# Patient Record
Sex: Female | Born: 1990 | Race: Black or African American | Hispanic: No | Marital: Married | Smoking: Never smoker
Health system: Southern US, Community
[De-identification: ages and names within clinical notes are randomized; demographics above are authoritative.]

## PROBLEM LIST (undated history)

## (undated) ENCOUNTER — Inpatient Hospital Stay (HOSPITAL_COMMUNITY): Payer: Self-pay

## (undated) DIAGNOSIS — Z8759 Personal history of other complications of pregnancy, childbirth and the puerperium: Secondary | ICD-10-CM

## (undated) DIAGNOSIS — R030 Elevated blood-pressure reading, without diagnosis of hypertension: Secondary | ICD-10-CM

## (undated) HISTORY — DX: Elevated blood-pressure reading, without diagnosis of hypertension: R03.0

---

## 2011-10-12 ENCOUNTER — Encounter: Payer: Self-pay | Admitting: *Deleted

## 2011-10-12 ENCOUNTER — Emergency Department (INDEPENDENT_AMBULATORY_CARE_PROVIDER_SITE_OTHER)
Admission: EM | Admit: 2011-10-12 | Discharge: 2011-10-12 | Disposition: A | Discharge status: Home / Self Care | Payer: 59 | Source: Home / Self Care | Attending: Family Medicine | Admitting: Family Medicine

## 2011-10-12 DIAGNOSIS — B9689 Other specified bacterial agents as the cause of diseases classified elsewhere: Secondary | ICD-10-CM

## 2011-10-12 DIAGNOSIS — J029 Acute pharyngitis, unspecified: Secondary | ICD-10-CM

## 2011-10-12 MED ORDER — HYDROCODONE-ACETAMINOPHEN 5-500 MG PO TABS: 1.0000 | ORAL_TABLET | Freq: Four times a day (QID) | ORAL | Status: AC | PRN

## 2011-10-12 MED ORDER — AMOXICILLIN 500 MG PO CAPS: ORAL_CAPSULE | ORAL | Status: DC

## 2011-10-12 MED ORDER — IBUPROFEN 600 MG PO TABS: 600.0000 mg | ORAL_TABLET | Freq: Three times a day (TID) | ORAL | Status: AC | PRN

## 2011-10-12 NOTE — ED Notes (Signed)
Pt  Reports  Symptoms  Of  sorethroat  Body  Aches  As  Well as    Swollen tencer  Neck  Glands  Which  She reports  Began  yest    She  Is  Sitting  Upright on the  Exam table  Speaking in  Complete  sentances  And  aapears  In no  aciute  Distress

## 2011-10-15 NOTE — ED Provider Notes (Signed)
History     CSN: [id number]  Arrival date & time 10/12/11  1646   First MD Initiated Contact with Patient 10/12/11 1704      Chief Complaint  Patient presents with  . Sore Throat    (Consider location/radiation/quality/duration/timing/severity/associated sxs/prior treatment) HPI Comments: 20 y/o female here with sore throat, body aches and headache for 2 days. Subjective fever. Mild discomfort with swallowing but no dysphagia. Drinking fluids and eating solids well. No abdominal pain, denies cough and rhinorrhea.   History reviewed. No pertinent past medical history.  History reviewed. No pertinent past surgical history.  History reviewed. No pertinent family history.  History  Substance Use Topics  . Smoking status: Not on file  . Smokeless tobacco: Not on file  . Alcohol Use: Not on file    OB History    Grav Para Term Preterm Abortions TAB SAB Ect Mult Living                  Review of Systems  Constitutional: Positive for fever and chills. Negative for appetite change.  HENT: Positive for sore throat. Negative for ear pain, congestion, rhinorrhea, trouble swallowing, neck pain, voice change and sinus pressure.   Eyes: Negative for discharge.  Respiratory: Negative for cough, chest tightness, shortness of breath and wheezing.     Allergies  Review of patient's allergies indicates no known allergies.  Home Medications   Current Outpatient Rx  Name Route Sig Dispense Refill  . LEVONORGESTREL-ETHINYL ESTRAD 0.1-20 MG-MCG PO TABS Oral Take 1 tablet by mouth daily.      . AMOXICILLIN 500 MG PO CAPS  1 tab po tid for 10 days 30 capsule 0  . HYDROCODONE-ACETAMINOPHEN 5-500 MG PO TABS Oral Take 1-2 tablets by mouth every 6 (six) hours as needed for pain. 10 tablet 0  . IBUPROFEN 600 MG PO TABS Oral Take 1 tablet (600 mg total) by mouth every 8 (eight) hours as needed for pain or fever. 20 tablet 0    BP 141/95  Pulse 95  Temp(Src) 99 F (37.2 C) (Oral)  Resp  20  SpO2 100%  LMP 10/12/2011  Physical Exam  Nursing note and vitals reviewed. Constitutional: She is oriented to person, place, and time. She appears well-developed and well-nourished. No distress.  HENT:  Head: Normocephalic and atraumatic.  Right Ear: External ear normal.  Left Ear: External ear normal.  Nose: Nose normal.        Significant pharyngeal erythema with bilateral exudates. No uvula deviation. No trismus. TM's normal.   Eyes: Conjunctivae and EOM are normal. Pupils are equal, round, and reactive to light. Right eye exhibits no discharge. Left eye exhibits no discharge.  Neck: Normal range of motion. Neck supple.  Cardiovascular: Normal rate, regular rhythm and normal heart sounds.  Exam reveals no gallop and no friction rub.   No murmur heard. Pulmonary/Chest: Effort normal and breath sounds normal. No respiratory distress. She has no wheezes. She has no rales. She exhibits no tenderness.  Abdominal: Soft. She exhibits no distension and no mass. There is no tenderness.  Lymphadenopathy:    She has cervical adenopathy.  Neurological: She is alert and oriented to person, place, and time.  Skin: Skin is warm. No rash noted.    ED Course  Procedures (including critical care time)  Labs Reviewed - No data to display No results found.   1. Bacterial pharyngitis       MDM  Centor criteria for bacteria for bacterial pharyngitis .  Treated with amoxicillin.        Sharin Grave, MD 10/15/11 1034

## 2012-05-12 ENCOUNTER — Ambulatory Visit (INDEPENDENT_AMBULATORY_CARE_PROVIDER_SITE_OTHER): Payer: 59 | Admitting: Family Medicine

## 2012-05-12 VITALS — BP 122/76 | HR 103 | Temp 98.7°F | Resp 16 | Ht 65.5 in | Wt 241.0 lb

## 2012-05-12 DIAGNOSIS — IMO0002 Reserved for concepts with insufficient information to code with codable children: Secondary | ICD-10-CM

## 2012-05-12 DIAGNOSIS — R51 Headache: Secondary | ICD-10-CM

## 2012-05-12 DIAGNOSIS — E669 Obesity, unspecified: Secondary | ICD-10-CM

## 2012-05-12 DIAGNOSIS — M25559 Pain in unspecified hip: Secondary | ICD-10-CM

## 2012-05-12 MED ORDER — BUTALBITAL-APAP-CAFFEINE 50-325-40 MG PO TABS: 2.0000 | ORAL_TABLET | Freq: Four times a day (QID) | ORAL | Status: DC | PRN

## 2012-05-12 MED ORDER — CYCLOBENZAPRINE HCL 5 MG PO TABS: 5.0000 mg | ORAL_TABLET | Freq: Three times a day (TID) | ORAL | Status: DC

## 2012-05-12 MED ORDER — TRAMADOL HCL 50 MG PO TABS: 50.0000 mg | ORAL_TABLET | Freq: Three times a day (TID) | ORAL | Status: DC | PRN

## 2012-05-12 MED ORDER — CYCLOBENZAPRINE HCL 5 MG PO TABS: 5.0000 mg | ORAL_TABLET | Freq: Three times a day (TID) | ORAL | Status: AC

## 2012-05-12 MED ORDER — TRAMADOL HCL 50 MG PO TABS: 50.0000 mg | ORAL_TABLET | Freq: Three times a day (TID) | ORAL | Status: AC | PRN

## 2012-05-12 MED ORDER — BUTALBITAL-APAP-CAFFEINE 50-325-40 MG PO TABS: 1.0000 | ORAL_TABLET | Freq: Four times a day (QID) | ORAL | Status: AC | PRN

## 2012-05-12 NOTE — Progress Notes (Signed)
  [facility]:  Office Visit  Chief Complaint:  Chief Complaint  Patient presents with  . Sciatica    Right side x 1 yr off and on but worse last day and a half  . Headache    x 5 years    HPI: Donna Mccullough is a 21 y.o. female who complains of  Right hip pain x 1 year intermittent. 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 Walking would cause her to get a sharp pain 6/10 where she would  jump. Today constant. NKI, no prior trauma.  Deneis catching, clicking or popping. Has not tried anything for it.   Past Medical History  Diagnosis Date  . Hypertension    No past surgical history on file. History   Social History  . Marital Status: Single    Spouse Name: N/A    Number of Children: N/A  . Years of Education: N/A   Social History Main Topics  . Smoking status: Never Smoker   . Smokeless tobacco: None  . Alcohol Use: None  . Drug Use: None  . Sexually Active: None   Other Topics Concern  . None   Social History Narrative  . None   No family history on file. No Known Allergies Prior to Admission medications   Medication Sig Start Date End Date Taking? Authorizing Provider  levonorgestrel-ethinyl estradiol (AVIANE,ASSE,SSINA) 0.1-20 MG-MCG tablet Take 1 tablet by mouth daily.     Yes Historical Provider, MD     ROS: The patient denies fevers, chills, night sweats, unintentional weight loss, chest pain, palpitations, wheezing, dyspnea on exertion, nausea, vomiting, abdominal pain, dysuria, hematuria, melena, numbness, weakness, or tingling.   All other systems have been reviewed and were otherwise negative with the exception of those mentioned in the HPI and as above.    PHYSICAL EXAM: Filed Vitals:   05/12/12 1938  BP: 122/76  Pulse: 103  Temp: 98.7 F (37.1 C)  Resp: 16   Filed Vitals:   05/12/12 1938  Height: 5' 5.5" (1.664 m)  Weight: 241 lb (109.317 kg)  Repeat pulse 90  Body mass index is 39.49 kg/(m^2).  General: Alert, no acute distress.  Morbidly Obese HEENT:  Normocephalic, atraumatic, oropharynx patent. EOMI, PERRLA, fundoscopic exam nl Cardiovascular:  Regular rate and rhythm, no rubs murmurs or gallops.  No Carotid bruits, radial pulse intact. No pedal edema.  Respiratory: Clear to auscultation bilaterally.  No wheezes, rales, or rhonchi.  No cyanosis, no use of accessory musculature GI: No organomegaly, abdomen is soft and non-tender, positive bowel sounds.  No masses. Skin: No rashes. Neurologic: Facial musculature symmetric. Psychiatric: Patient is appropriate throughout our interaction. Lymphatic: No cervical lymphadenopathy Musculoskeletal: Gait intact. L-spine-nl Hips-nl sensation, ROM, 5/5 strength bilaterally, 2/2 knee and ankle DTR, + leg raise right hip    LABS: No results found for this or any previous visit.   EKG/XRAY:   Primary read interpreted by Dr. Conley Rolls at [facility].   ASSESSMENT/PLAN: Encounter Diagnoses  Name Primary?  . Headache Yes  . Hip pain   . Sprain and strain of hip and thigh    Patient is morbidly obese. 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   1. Flexeril, Tramadol, Naproxen  2. Fioricet  F/u for xray if no improvement   ,  PHUONG, DO 05/15/2012 9:03 AM

## 2012-10-15 HISTORY — PX: BREAST REDUCTION SURGERY: SHX8

## 2013-02-19 ENCOUNTER — Telehealth: Payer: Self-pay | Admitting: *Deleted

## 2013-02-19 NOTE — Telephone Encounter (Signed)
Spoke to patient regarding referral from Zoe Lan NP-C requesting an appointment.  Patient declines to schedule at this time.  States she will need to check her schedule and will call us when she is ready to schedule.

## 2013-09-25 ENCOUNTER — Other Ambulatory Visit (HOSPITAL_COMMUNITY)
Admission: RE | Admit: 2013-09-25 | Discharge: 2013-09-25 | Disposition: A | Discharge status: Home / Self Care | Payer: 59 | Source: Ambulatory Visit | Attending: Emergency Medicine | Admitting: Emergency Medicine

## 2013-09-25 ENCOUNTER — Emergency Department (INDEPENDENT_AMBULATORY_CARE_PROVIDER_SITE_OTHER)
Admission: EM | Admit: 2013-09-25 | Discharge: 2013-09-25 | Disposition: A | Discharge status: Home / Self Care | Payer: 59 | Source: Home / Self Care | Attending: Emergency Medicine | Admitting: Emergency Medicine

## 2013-09-25 ENCOUNTER — Encounter (HOSPITAL_COMMUNITY): Payer: Self-pay | Admitting: Emergency Medicine

## 2013-09-25 DIAGNOSIS — N760 Acute vaginitis: Secondary | ICD-10-CM | POA: Insufficient documentation

## 2013-09-25 DIAGNOSIS — K529 Noninfective gastroenteritis and colitis, unspecified: Secondary | ICD-10-CM

## 2013-09-25 DIAGNOSIS — K5289 Other specified noninfective gastroenteritis and colitis: Secondary | ICD-10-CM

## 2013-09-25 DIAGNOSIS — Z113 Encounter for screening for infections with a predominantly sexual mode of transmission: Secondary | ICD-10-CM | POA: Insufficient documentation

## 2013-09-25 DIAGNOSIS — N730 Acute parametritis and pelvic cellulitis: Secondary | ICD-10-CM

## 2013-09-25 LAB — CBC WITH DIFFERENTIAL/PLATELET
Basophils Absolute: 0 10*3/uL (ref 0.0–0.1)
Eosinophils Relative: 1 % (ref 0–5)
HCT: 35.2 % — ABNORMAL LOW (ref 36.0–46.0)
Lymphocytes Relative: 24 % (ref 12–46)
MCV: 80.7 fL (ref 78.0–100.0)
Monocytes Absolute: 0.5 10*3/uL (ref 0.1–1.0)
RDW: 13.8 % (ref 11.5–15.5)
WBC: 4.5 10*3/uL (ref 4.0–10.5)

## 2013-09-25 LAB — POCT URINALYSIS DIP (DEVICE)
Ketones, ur: NEGATIVE mg/dL
Protein, ur: NEGATIVE mg/dL
Specific Gravity, Urine: >=1.03 (ref 1.005–1.030)
pH: 7 (ref 5.0–8.0)

## 2013-09-25 LAB — POCT I-STAT, CHEM 8
BUN: 8 mg/dL (ref 6–23)
HCT: 40 % (ref 36.0–46.0)
Hemoglobin: 13.6 g/dL (ref 12.0–15.0)
Sodium: 140 mEq/L (ref 135–145)
TCO2: 24 mmol/L (ref 0–100)

## 2013-09-25 LAB — POCT PREGNANCY, URINE: Preg Test, Ur: NEGATIVE

## 2013-09-25 MED ORDER — CEFTRIAXONE SODIUM 250 MG IJ SOLR
INTRAMUSCULAR | Status: AC
  Filled 2013-09-25: qty 250

## 2013-09-25 MED ORDER — IBUPROFEN 800 MG PO TABS
800.0000 mg | ORAL_TABLET | Freq: Once | ORAL | Status: AC
Start: 2013-09-25 — End: 2013-09-25
  Administered 2013-09-25: 800 mg via ORAL

## 2013-09-25 MED ORDER — AZITHROMYCIN 250 MG PO TABS
1000.0000 mg | ORAL_TABLET | Freq: Once | ORAL | Status: AC
  Administered 2013-09-25: 1000 mg via ORAL

## 2013-09-25 MED ORDER — CIPROFLOXACIN HCL 500 MG PO TABS: 500.0000 mg | ORAL_TABLET | Freq: Two times a day (BID) | ORAL | Status: DC

## 2013-09-25 MED ORDER — IBUPROFEN 800 MG PO TABS
ORAL_TABLET | ORAL | Status: AC
  Filled 2013-09-25: qty 1

## 2013-09-25 MED ORDER — ONDANSETRON 8 MG PO TBDP: 8.0000 mg | ORAL_TABLET | Freq: Three times a day (TID) | ORAL | Status: DC | PRN

## 2013-09-25 MED ORDER — METRONIDAZOLE 500 MG PO TABS: 500.0000 mg | ORAL_TABLET | Freq: Three times a day (TID) | ORAL | Status: DC

## 2013-09-25 MED ORDER — CEFTRIAXONE SODIUM 250 MG IJ SOLR
250.0000 mg | Freq: Once | INTRAMUSCULAR | Status: AC
  Administered 2013-09-25: 250 mg via INTRAMUSCULAR

## 2013-09-25 MED ORDER — TRAMADOL HCL 50 MG PO TABS: 100.0000 mg | ORAL_TABLET | Freq: Three times a day (TID) | ORAL | Status: DC | PRN

## 2013-09-25 MED ORDER — AZITHROMYCIN 250 MG PO TABS
ORAL_TABLET | ORAL | Status: AC
  Filled 2013-09-25: qty 4

## 2013-09-25 NOTE — ED Notes (Signed)
Patient unable to give stool specimen.  Patient given instruction and supplies to bring stool specimen in this weekend.  Dr Lorenz Coaster notified

## 2013-09-25 NOTE — ED Provider Notes (Signed)
Chief Complaint:   Chief Complaint  Patient presents with  . Abdominal Cramping    History of Present Illness:   Donna Mccullough is a 22 year old female who has had symptoms going on for the past week of diarrhea, headaches, sweats, pruritus, cramping, vaginal pain, nausea, vomiting, and fever. The patient states her symptoms began a week ago with loose stools. She would have about 3 stools a day and they would usually occur after a meal. There was occasionally some blood in the toilet tissue. This was not a great deal of blood, just a few small spots. The patient cannot recall having eaten anything suspicious or been exposed to anyone with similar symptoms. No recent foreign travel or animal exposure. She then developed migraine type headaches which occur almost every day. She has a history of migraine headaches in the past, but states these went away for a while after she had a breast reduction. She denies any visual or neurological symptoms. They've been accompanied by photophobia and phonophobia. She had couple episodes of cold sweats. Several days ago her face felt itchy but she did not have any rash. She's had menstrual-like cramps in her lower abdomen with some spotting bleeding. Last menstrual period was November 26. She is sexually active and takes tri-Sprintec for birth control. She's had daily vaginal pain. She felt nauseated and vomited for his one day, a couple days ago, but now this is stopped. Today she has a fever 101.8.  Review of Systems:  Other than noted above, the patient denies any of the following symptoms: Systemic:  No fevers, chills, sweats, weight loss or gain, fatigue, or tiredness. ENT:  No nasal congestion, rhinorrhea, or sore throat. Lungs:  No cough, wheezing, or shortness of breath. Cardiac:  No chest pain, syncope, or presyncope. GI:  No abdominal pain, nausea, vomiting, anorexia, diarrhea, constipation, blood in stool or vomitus. GU:  No dysuria, frequency, or  urgency.  PMFSH:  Past medical history, family history, social history, meds, and allergies were reviewed.  She has a history of fatty liver.  Physical Exam:   Vital signs:  BP 133/81  Pulse 120  Temp(Src) 101.8 F (38.8 C) (Oral)  Resp 18  SpO2 100%  LMP 09/09/2013 Filed Vitals:   09/25/13 1659 Baseline  09/25/13 1747 Supine  09/25/13 1748 Sitting  09/25/13 1752 Standing   BP: 136/88 140/85 132/78 133/81  Pulse: 109 110 107 120  Temp: 101.8 F (38.8 C)     TempSrc: Oral     Resp: 18     SpO2: 100%      General:  Alert and oriented.  In no distress.  Skin warm and dry.  Good skin turgor, brisk capillary refill. ENT:  No scleral icterus, moist mucous membranes, no oral lesions, pharynx clear. Lungs:  Breath sounds clear and equal bilaterally.  No wheezes, rales, or rhonchi. Heart:  Rhythm regular, without extrasystoles.  No gallops or murmers. Abdomen:  Soft, flat, nondistended. No organomegaly or mass. Bowel sounds are normally active. No tenderness, guarding, or rebound. Pelvic exam: Normal external genitalia. Vaginal and cervical mucosa were normal. There was no discharge. She has moderate pain on cervical motion. Uterus is normal in size and shape and moderately tender. She has moderate bilateral adnexal tenderness but no mass. DNA probes for gonorrhea, Chlamydia, Trichomonas, and Gardnerella were obtained. Skin: Clear, warm, and dry.  Good turgor.  Brisk capillary refill.  Labs:   Results for orders placed during the hospital encounter of 09/25/13  CBC  WITH DIFFERENTIAL      Result Value Range   WBC 4.5  4.0 - 10.5 K/uL   RBC 4.36  3.87 - 5.11 MIL/uL   Hemoglobin 11.8 (*) 12.0 - 15.0 g/dL   HCT 47.8 (*) 29.5 - 62.1 %   MCV 80.7  78.0 - 100.0 fL   MCH 27.1  26.0 - 34.0 pg   MCHC 33.5  30.0 - 36.0 g/dL   RDW 30.8  65.7 - 84.6 %   Platelets 285  150 - 400 K/uL   Neutrophils Relative % 65  43 - 77 %   Neutro Abs 2.9  1.7 - 7.7 K/uL   Lymphocytes Relative 24  12 - 46 %    Lymphs Abs 1.1  0.7 - 4.0 K/uL   Monocytes Relative 10  3 - 12 %   Monocytes Absolute 0.5  0.1 - 1.0 K/uL   Eosinophils Relative 1  0 - 5 %   Eosinophils Absolute 0.0  0.0 - 0.7 K/uL   Basophils Relative 0  0 - 1 %   Basophils Absolute 0.0  0.0 - 0.1 K/uL  POCT URINALYSIS DIP (DEVICE)      Result Value Range   Glucose, UA NEGATIVE  NEGATIVE mg/dL   Bilirubin Urine NEGATIVE  NEGATIVE   Ketones, ur NEGATIVE  NEGATIVE mg/dL   Specific Gravity, Urine >=1.030  1.005 - 1.030   Hgb urine dipstick SMALL (*) NEGATIVE   pH 7.0  5.0 - 8.0   Protein, ur NEGATIVE  NEGATIVE mg/dL   Urobilinogen, UA 0.2  0.0 - 1.0 mg/dL   Nitrite NEGATIVE  NEGATIVE   Leukocytes, UA NEGATIVE  NEGATIVE  POCT PREGNANCY, URINE      Result Value Range   Preg Test, Ur NEGATIVE  NEGATIVE  POCT I-STAT, CHEM 8      Result Value Range   Sodium 140  135 - 145 mEq/L   Potassium 3.6  3.5 - 5.1 mEq/L   Chloride 103  96 - 112 mEq/L   BUN 8  6 - 23 mg/dL   Creatinine, Ser 9.62  0.50 - 1.10 mg/dL   Glucose, Bld 89  70 - 99 mg/dL   Calcium, Ion 9.52  8.41 - 1.23 mmol/L   TCO2 24  0 - 100 mmol/L   Hemoglobin 13.6  12.0 - 15.0 g/dL   HCT 32.4  40.1 - 02.7 %     Course in Urgent Care Center:   She was given Motrin 800 mg for pain. For her PID she was given Rocephin 250 mg IM and azithromycin 1000 mg by mouth.  Assessment:  The primary encounter diagnosis was Gastroenteritis. A diagnosis of PID (acute pelvic inflammatory disease) was also pertinent to this visit.  Since the gastroenteritis has been going on for now 7 days, it's more likely be a bacterial gastroenteritis. I ordered a stool culture, but she was not able to provide a specimen tonight. She'll try to get one tomorrow morning and bring up by the lab here. Will start presumptively on Cipro and metronidazole. This also should cover for PID as well. She was given Zofran for the nausea and tramadol for the pain. She has an appointment to see her primary care nurse  practitioner on Monday.  Plan:   1.  Meds:  The following meds were prescribed:   Discharge Medication List as of 09/25/2013  7:10 PM    START taking these medications   Details  ciprofloxacin (CIPRO) 500 MG tablet Take  1 tablet (500 mg total) by mouth every 12 (twelve) hours., Starting 09/25/2013, Until Discontinued, Normal    metroNIDAZOLE (FLAGYL) 500 MG tablet Take 1 tablet (500 mg total) by mouth 3 (three) times daily., Starting 09/25/2013, Until Discontinued, Normal    ondansetron (ZOFRAN ODT) 8 MG disintegrating tablet Take 1 tablet (8 mg total) by mouth every 8 (eight) hours as needed for nausea., Starting 09/25/2013, Until Discontinued, Normal    traMADol (ULTRAM) 50 MG tablet Take 2 tablets (100 mg total) by mouth every 8 (eight) hours as needed., Starting 09/25/2013, Until Discontinued, Normal        2.  Patient Education/Counseling:  The patient was given appropriate handouts, self care instructions, and instructed in symptomatic relief. The patient was told to stay on clear liquids for the remainder of the day, then advance to a B.R.A.T. diet starting tomorrow.  3.  Follow up:  The patient was told to follow up if no better in 3 to 4 days, if becoming worse in any way, and given some red flag symptoms such as increasing fever, persistent vomiting, or worsening pain which would prompt immediate return.  Follow up with her primary care nurse practitioner this coming Monday.       Reuben Likes, MD 09/25/13 2126

## 2013-09-25 NOTE — ED Notes (Signed)
Pt  Reports  Low  abd  Pain  With  Cramping  X  5  Days     With  Loose  Stool  And  Headache         She    Ambulated  To  Room  She  Is  Sitting  Upright  On  Exam  Room    Table

## 2013-09-27 LAB — URINE CULTURE

## 2015-05-17 ENCOUNTER — Encounter (INDEPENDENT_AMBULATORY_CARE_PROVIDER_SITE_OTHER): Payer: Self-pay

## 2015-05-17 ENCOUNTER — Encounter: Payer: Self-pay | Admitting: Primary Care

## 2015-05-17 ENCOUNTER — Ambulatory Visit (INDEPENDENT_AMBULATORY_CARE_PROVIDER_SITE_OTHER): Payer: 59 | Admitting: Primary Care

## 2015-05-17 VITALS — BP 118/82 | HR 96 | Temp 98.5°F | Ht 66.0 in | Wt 259.8 lb

## 2015-05-17 DIAGNOSIS — R51 Headache: Secondary | ICD-10-CM

## 2015-05-17 DIAGNOSIS — R519 Headache, unspecified: Secondary | ICD-10-CM

## 2015-05-17 NOTE — Assessment & Plan Note (Signed)
Present since teenage years. Will get them now twice weekly and manages without taking medications. Last migraine was 2 months ago. Will continue to monitor.

## 2015-05-17 NOTE — Patient Instructions (Signed)
Please schedule a physical with me in the next 3-6 months. You will also schedule a lab only appointment one week prior. We will discuss your lab results during your physical.  It was a pleasure to meet you today! Please don't hesitate to call me with any questions. Welcome to [facility]!   

## 2015-05-17 NOTE — Progress Notes (Signed)
Pre visit review using our clinic review tool, if applicable. No additional management support is needed unless otherwise documented below in the visit note. 

## 2015-05-17 NOTE — Progress Notes (Signed)
   Subjective:    Patient ID: Donna Mccullough, female    DOB: 06-Jun-1991, 24 y.o.   MRN: [id number]  HPI  Donna Mccullough is a 24 year old female who presents today to establish care and discuss the problems mentioned below. Will obtain old records.  1) Depression: Diagnosed one year ago, was prescribed medication once, but never took it. She doesn't feel any depressive symptoms currently. Feels well today. Denies anxiety.  2) Frequent headaches: Occur 2-3 times weekly and has had them since a teenager. She once had "bad" migraines in the past. Last migraine was 2 months ago. Her headaches are mostly located to the right temporal and frontal region of her head. She does not take any medication for her headaches as they will dissipate on their own. She is currently not on any preventative medication at this time.   Review of Systems  Constitutional: Negative for unexpected weight change.  HENT: Negative for rhinorrhea.   Respiratory: Negative for cough and shortness of breath.   Cardiovascular: Negative for chest pain.  Gastrointestinal: Negative for diarrhea and constipation.  Genitourinary: Negative for difficulty urinating.       Periods regular.  Musculoskeletal: Negative for myalgias and arthralgias.  Skin: Negative for rash.  Neurological: Negative for dizziness and numbness.       Occasional headaches  Psychiatric/Behavioral:       See HPI       Past Medical History  Diagnosis Date  . Elevated blood pressure reading     History   Social History  . Marital Status: Single    Spouse Name: N/A  . Number of Children: N/A  . Years of Education: N/A   Occupational History  . Not on file.   Social History Main Topics  . Smoking status: Never Smoker   . Smokeless tobacco: Not on file  . Alcohol Use: 0.0 oz/week    0 Standard drinks or equivalent per week     Comment: socially  . Drug Use: Not on file  . Sexual Activity: Not on file   Other Topics Concern  . Not on file    Social History Narrative   Married.   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   Highest level of education was bachelors.     Past Surgical History  Procedure Laterality Date  . Breast reduction surgery Bilateral 2014    Family History  Problem Relation Age of Onset  . Hypertension Mother   . Ulcers Mother     No Known Allergies  No current outpatient prescriptions on file prior to visit.   No current facility-administered medications on file prior to visit.    BP 118/82 mmHg  Pulse 96  Temp(Src) 98.5 F (36.9 C) (Oral)  Ht  (1.676 m)  Wt 259 lb 12.8 oz (117.845 kg)  BMI 41.95 kg/m2  SpO2 98%  LMP 04/20/2015    Objective:   Physical Exam  Constitutional: She is oriented to person, place, and time. She appears well-nourished.  Cardiovascular: Normal rate and regular rhythm.   Pulmonary/Chest: Effort normal and breath sounds normal.  Neurological: She is alert and oriented to person, place, and time.  Skin: Skin is warm and dry.  Psychiatric: She has a normal mood and affect.          Assessment & Plan:

## 2015-06-13 ENCOUNTER — Encounter: Payer: Self-pay | Admitting: Obstetrics & Gynecology

## 2015-06-13 ENCOUNTER — Ambulatory Visit (INDEPENDENT_AMBULATORY_CARE_PROVIDER_SITE_OTHER): Payer: 59 | Admitting: Obstetrics & Gynecology

## 2015-06-13 VITALS — BP 139/84 | HR 97 | Wt 260.0 lb

## 2015-06-13 DIAGNOSIS — E669 Obesity, unspecified: Secondary | ICD-10-CM

## 2015-06-13 DIAGNOSIS — Z113 Encounter for screening for infections with a predominantly sexual mode of transmission: Secondary | ICD-10-CM | POA: Diagnosis not present

## 2015-06-13 DIAGNOSIS — Z3401 Encounter for supervision of normal first pregnancy, first trimester: Secondary | ICD-10-CM

## 2015-06-13 DIAGNOSIS — Z3400 Encounter for supervision of normal first pregnancy, unspecified trimester: Secondary | ICD-10-CM | POA: Insufficient documentation

## 2015-06-13 DIAGNOSIS — O99211 Obesity complicating pregnancy, first trimester: Secondary | ICD-10-CM | POA: Diagnosis not present

## 2015-06-13 DIAGNOSIS — Z124 Encounter for screening for malignant neoplasm of cervix: Secondary | ICD-10-CM | POA: Diagnosis not present

## 2015-06-13 DIAGNOSIS — Z6841 Body Mass Index (BMI) 40.0 and over, adult: Secondary | ICD-10-CM

## 2015-06-13 NOTE — Progress Notes (Signed)
   Subjective:    Donna Mccullough is a M AA G2P0010 [redacted]w[redacted]d being seen today for her first obstetrical visit.  Her obstetrical history is significant for obesity. Patient does intend to breast feed. Pregnancy history fully reviewed.  Patient reports no complaints.  Filed Vitals:   06/13/15 1545  BP: 139/84  Pulse: 97  Weight: 260 lb (117.935 kg)    HISTORY: OB History  Gravida Para Term Preterm AB SAB TAB Ectopic Multiple Living  # Outcome Date GA Lbr Len/2nd Weight Sex Delivery Anes PTL Lv  2 Current           1 TAB              Past Medical History  Diagnosis Date  . Elevated blood pressure reading    Past Surgical History  Procedure Laterality Date  . Breast reduction surgery Bilateral 2014   Family History  Problem Relation Age of Onset  . Hypertension Mother   . Ulcers Mother   . Cancer Maternal Grandfather     prostate  . Cancer Paternal Grandfather     prostate  . Diabetes Paternal Grandfather   . Hypertension Paternal Grandfather   . Hyperlipidemia Paternal Grandfather      Exam    Uterus:     Pelvic Exam:    Perineum: No Hemorrhoids   Vulva: normal   Vagina:  normal mucosa   pH:    Cervix: anteverted   Adnexa: normal adnexa   Bony Pelvis: android  System: Breast:  normal appearance, no masses or tenderness   Skin: normal coloration and turgor, no rashes    Neurologic: oriented   Extremities: normal strength, tone, and muscle mass   HEENT PERRLA   Mouth/Teeth mucous membranes moist, pharynx normal without lesions   Neck supple   Cardiovascular: regular rate and rhythm   Respiratory:  appears well, vitals normal, no respiratory distress, acyanotic, normal RR, ear and throat exam is normal, neck free of mass or lymphadenopathy, chest clear, no wheezing, crepitations, rhonchi, normal symmetric air entry   Abdomen: soft, non-tender; bowel sounds normal; no masses,  no organomegaly   Urinary: urethral meatus normal       Assessment:    Pregnancy: G2P0010 Patient Active Problem List   Diagnosis Date Noted  . Frequent headaches 05/17/2015        Plan:     Initial labs drawn. Prenatal vitamins. Problem list reviewed and updated. Genetic Screening discussed First Screen and Quad Screen: undecided.  Ultrasound discussed; fetal survey: requested.  Follow up in 4 weeks. **   Tramaine Snell C. 06/13/2015

## 2015-06-13 NOTE — Addendum Note (Signed)
Addended by: Tandy Gaw C on: 06/13/2015 04:15 PM   Modules accepted: Orders

## 2015-06-13 NOTE — Progress Notes (Signed)
Bedside ultrasound today shows [redacted]w[redacted]d fetus with heartbeat.  

## 2015-06-14 LAB — PRENATAL PROFILE (SOLSTAS)
Antibody Screen: NEGATIVE
BASOS ABS: 0 10*3/uL (ref 0.0–0.1)
Basophils Relative: 0 % (ref 0–1)
EOS PCT: 1 % (ref 0–5)
Eosinophils Absolute: 0.1 10*3/uL (ref 0.0–0.7)
HCT: 34.8 % — ABNORMAL LOW (ref 36.0–46.0)
HIV: NONREACTIVE
Hemoglobin: 11.4 g/dL — ABNORMAL LOW (ref 12.0–15.0)
Hepatitis B Surface Ag: NEGATIVE
LYMPHS ABS: 2.1 10*3/uL (ref 0.7–4.0)
LYMPHS PCT: 26 % (ref 12–46)
MCH: 27.2 pg (ref 26.0–34.0)
MCHC: 32.8 g/dL (ref 30.0–36.0)
MCV: 83.1 fL (ref 78.0–100.0)
MONOS PCT: 7 % (ref 3–12)
MPV: 10.1 fL (ref 8.6–12.4)
Monocytes Absolute: 0.6 10*3/uL (ref 0.1–1.0)
Neutro Abs: 5.2 10*3/uL (ref 1.7–7.7)
Neutrophils Relative %: 66 % (ref 43–77)
PLATELETS: 290 10*3/uL (ref 150–400)
RBC: 4.19 MIL/uL (ref 3.87–5.11)
RDW: 14.7 % (ref 11.5–15.5)
RH TYPE: POSITIVE
RUBELLA: 3.91 {index} — AB (ref <0.90)
WBC: 7.9 10*3/uL (ref 4.0–10.5)

## 2015-06-14 LAB — CYTOLOGY - PAP

## 2015-06-16 LAB — CULTURE, OB URINE

## 2015-06-27 ENCOUNTER — Encounter: Payer: Self-pay | Admitting: Obstetrics & Gynecology

## 2015-06-27 DIAGNOSIS — B951 Streptococcus, group B, as the cause of diseases classified elsewhere: Secondary | ICD-10-CM | POA: Insufficient documentation

## 2015-06-27 DIAGNOSIS — O2341 Unspecified infection of urinary tract in pregnancy, first trimester: Secondary | ICD-10-CM

## 2015-07-11 ENCOUNTER — Ambulatory Visit (INDEPENDENT_AMBULATORY_CARE_PROVIDER_SITE_OTHER): Payer: 59 | Admitting: Obstetrics & Gynecology

## 2015-07-11 VITALS — BP 113/80 | HR 101 | Wt 257.0 lb

## 2015-07-11 DIAGNOSIS — O99210 Obesity complicating pregnancy, unspecified trimester: Secondary | ICD-10-CM | POA: Diagnosis not present

## 2015-07-11 DIAGNOSIS — Z3401 Encounter for supervision of normal first pregnancy, first trimester: Secondary | ICD-10-CM

## 2015-07-11 NOTE — Progress Notes (Signed)
Subjective:  Donna Mccullough is a 24 y.o. G2P0010 at [redacted]w[redacted]d being seen today for ongoing prenatal care.  Patient reports no complaints.  Contractions: Not present.  Vag. Bleeding: None. Movement: Absent. Denies leaking of fluid.   The following portions of the patient's history were reviewed and updated as appropriate: allergies, current medications, past family history, past medical history, past social history, past surgical history and problem list.   Objective:   Filed Vitals:   07/11/15 1551  BP: 113/80  Pulse: 101  Weight: 257 lb (116.574 kg)    Fetal Status: Fetal Heart Rate (bpm): + on [place]   Movement: Absent     General:  Alert, oriented and cooperative. Patient is in no acute distress.  Skin: Skin is warm and dry. No rash noted.   Cardiovascular: Normal heart rate noted  Respiratory: Normal respiratory effort, no problems with respiration noted  Abdomen: Soft, gravid, appropriate for gestational age. Pain/Pressure: Present     Pelvic: Vag. Bleeding: None Vag D/C Character: Thin  Cervical exam deferred        Extremities: Normal range of motion.  Edema: None  Mental Status: Normal mood and affect. Normal behavior. Normal judgment and thought content.   Urinalysis: Urine Protein: Negative Urine Glucose: Negative  Assessment and Plan:  Pregnancy: G2P0010 at [redacted]w[redacted]d  1. Obesity in pregnancy Nutritionist referral done. Follow up 1 hr GTT. - Glucose Tolerance, 1 HR (50g) w/o Fasting  2. Encounter for supervision of normal first pregnancy in first trimester - [place] MFM Fetal Nuchal Translucency; Future ordered - AFP only draw next visit  - Anatomy scan ordered  Routine obstetric precautions reviewed. Please refer to After Visit Summary for other counseling recommendations.  Return in about 4 weeks (around 08/08/2015) for OB Visit.   Tereso Newcomer, MD

## 2015-07-11 NOTE — Patient Instructions (Signed)
Return to clinic for any obstetric concerns or go to [facility] for evaluation  

## 2015-07-12 ENCOUNTER — Other Ambulatory Visit: Payer: Self-pay | Admitting: Obstetrics & Gynecology

## 2015-07-12 ENCOUNTER — Telehealth: Payer: Self-pay | Admitting: *Deleted

## 2015-07-12 ENCOUNTER — Encounter: Payer: Self-pay | Admitting: Obstetrics & Gynecology

## 2015-07-12 DIAGNOSIS — R030 Elevated blood-pressure reading, without diagnosis of hypertension: Secondary | ICD-10-CM | POA: Insufficient documentation

## 2015-07-12 DIAGNOSIS — O99211 Obesity complicating pregnancy, first trimester: Secondary | ICD-10-CM

## 2015-07-12 LAB — GLUCOSE TOLERANCE, 1 HOUR (50G) W/O FASTING: GLUCOSE 1 HOUR GTT: 124 mg/dL (ref 70–140)

## 2015-07-12 MED ORDER — ASPIRIN EC 81 MG PO TBEC: 81.0000 mg | DELAYED_RELEASE_TABLET | Freq: Every day | ORAL | Status: DC

## 2015-07-12 NOTE — Telephone Encounter (Signed)
Spoke to pt about normal 1 hr GTT result, also informed her that Dr Macon Large recommended her to take Aspirin 81 mg daily to help with the prevention of preeclampsia due to her borderline BP readings.  Pt acknowledged instructions.

## 2015-07-19 ENCOUNTER — Ambulatory Visit: Payer: 59 | Admitting: Dietician

## 2015-07-22 ENCOUNTER — Ambulatory Visit (HOSPITAL_COMMUNITY)
Admission: RE | Admit: 2015-07-22 | Discharge: 2015-07-22 | Disposition: A | Discharge status: Home / Self Care | Payer: 59 | Source: Ambulatory Visit | Attending: Obstetrics & Gynecology | Admitting: Obstetrics & Gynecology

## 2015-07-22 ENCOUNTER — Ambulatory Visit (HOSPITAL_COMMUNITY): Admission: RE | Admit: 2015-07-22 | Payer: 59 | Source: Ambulatory Visit

## 2015-07-22 ENCOUNTER — Encounter (HOSPITAL_COMMUNITY): Payer: Self-pay

## 2015-07-22 DIAGNOSIS — Z3401 Encounter for supervision of normal first pregnancy, first trimester: Secondary | ICD-10-CM

## 2015-07-29 ENCOUNTER — Ambulatory Visit (HOSPITAL_COMMUNITY): Payer: 59

## 2015-07-29 ENCOUNTER — Inpatient Hospital Stay (HOSPITAL_COMMUNITY)
Admission: AD | Admit: 2015-07-29 | Discharge: 2015-07-29 | Disposition: A | Discharge status: Home / Self Care | Payer: BLUE CROSS/BLUE SHIELD | Source: Ambulatory Visit | Attending: Obstetrics & Gynecology | Admitting: Obstetrics & Gynecology

## 2015-07-29 ENCOUNTER — Encounter (HOSPITAL_COMMUNITY): Payer: Self-pay | Admitting: *Deleted

## 2015-07-29 ENCOUNTER — Telehealth: Payer: Self-pay | Admitting: Primary Care

## 2015-07-29 DIAGNOSIS — Z7982 Long term (current) use of aspirin: Secondary | ICD-10-CM | POA: Insufficient documentation

## 2015-07-29 DIAGNOSIS — Z3A14 14 weeks gestation of pregnancy: Secondary | ICD-10-CM | POA: Diagnosis not present

## 2015-07-29 DIAGNOSIS — R102 Pelvic and perineal pain: Secondary | ICD-10-CM | POA: Diagnosis present

## 2015-07-29 DIAGNOSIS — O26892 Other specified pregnancy related conditions, second trimester: Secondary | ICD-10-CM | POA: Insufficient documentation

## 2015-07-29 DIAGNOSIS — B951 Streptococcus, group B, as the cause of diseases classified elsewhere: Secondary | ICD-10-CM

## 2015-07-29 DIAGNOSIS — O2341 Unspecified infection of urinary tract in pregnancy, first trimester: Secondary | ICD-10-CM

## 2015-07-29 DIAGNOSIS — Z3401 Encounter for supervision of normal first pregnancy, first trimester: Secondary | ICD-10-CM

## 2015-07-29 DIAGNOSIS — O26899 Other specified pregnancy related conditions, unspecified trimester: Secondary | ICD-10-CM

## 2015-07-29 LAB — WET PREP, GENITAL
Clue Cells Wet Prep HPF POC: NONE SEEN
Trich, Wet Prep: NONE SEEN
WBC, Wet Prep HPF POC: NONE SEEN
Yeast Wet Prep HPF POC: NONE SEEN

## 2015-07-29 LAB — URINALYSIS, ROUTINE W REFLEX MICROSCOPIC
BILIRUBIN URINE: NEGATIVE
GLUCOSE, UA: NEGATIVE mg/dL
HGB URINE DIPSTICK: NEGATIVE
KETONES UR: NEGATIVE mg/dL
Leukocytes, UA: NEGATIVE
Nitrite: NEGATIVE
PROTEIN: NEGATIVE mg/dL
Specific Gravity, Urine: 1.025 (ref 1.005–1.030)
UROBILINOGEN UA: 0.2 mg/dL (ref 0.0–1.0)
pH: 6 (ref 5.0–8.0)

## 2015-07-29 LAB — OB RESULTS CONSOLE GC/CHLAMYDIA: GC PROBE AMP, GENITAL: NEGATIVE

## 2015-07-29 LAB — OB RESULTS CONSOLE GBS: STREP GROUP B AG: POSITIVE

## 2015-07-29 NOTE — MAU Note (Signed)
Patient states she has been having pelvic pain that started this AM.  No vaginal bleeding or discharge noted.  Pain 6/10.  No other problems in pregnancy so far.

## 2015-07-29 NOTE — Telephone Encounter (Signed)
Pt spouse called to see if we had any appointments for this afternoon. Pt is having pelvic pain and she is pregnant. I informed pt's spouse that we do not have ultrasound machines in our office and it would be in the patient and babys best interest if they went straight to hospital. Spouse agreed and was taking her to the ER.

## 2015-07-29 NOTE — MAU Provider Note (Signed)
MAU HISTORY AND PHYSICAL  Chief Complaint:  Pelvic Pain   Donna Mccullough is a 24 y.o.  G2P0010 with IUP at 5570w2d presenting for Pelvic Pain  Pain began this morning. Right above pubic symphysis and on the right pelvis. Constant with intermittent sharp pangs. Consistent since this morning. No dysuria or hematuria, no back pain or fever or chills. No n/v/d. No vaginal discharge. Never had this type of pain before. Worse with walking and sitting. Normal appetite today. No vaginal bleeding or leakage of fluid.      Past Medical History  Diagnosis Date  . Elevated blood pressure reading     10/12/11 141/95  09/25/13 140/85  Initial BP at [redacted] weeks GA 139/84    Past Surgical History  Procedure Laterality Date  . Breast reduction surgery Bilateral 2014    Family History  Problem Relation Age of Onset  . Hypertension Mother   . Ulcers Mother   . Cancer Maternal Grandfather     prostate  . Cancer Paternal Grandfather     prostate  . Diabetes Paternal Grandfather   . Hypertension Paternal Grandfather   . Hyperlipidemia Paternal Grandfather     Social History  Substance Use Topics  . Smoking status: Never Smoker   . Smokeless tobacco: Never Used  . Alcohol Use: No     Comment: socially    No Known Allergies  Prescriptions prior to admission  Medication Sig Dispense Refill Last Dose  . acetaminophen (TYLENOL) 500 MG tablet Take 500 mg by mouth every 6 (six) hours as needed for moderate pain.   07/25/2015  . aspirin EC 81 MG tablet Take 1 tablet (81 mg total) by mouth daily. Take after 12 weeks for prevention of preeclampsia later in pregnancy 300 tablet 2 07/28/2015 at Unknown time  . Prenatal Vit-Fe Fumarate-FA (PRENATAL VITAMIN PO) Take 1 tablet by mouth daily.    07/28/2015 at Unknown time    Review of Systems - Negative except for what is mentioned in HPI.  Physical Exam  Blood pressure 135/75, pulse 85, temperature 98.6 F (37 C), temperature source Oral, resp. rate  18, height 5' 4.96" (1.65 m), last menstrual period 04/20/2015, SpO2 100 %. GENERAL: Well-developed, well-nourished female in no acute distress.  LUNGS: Clear to auscultation bilaterally.  HEART: Regular rate and rhythm. ABDOMEN: Soft, , nondistended, gravid. TTP right very lower quadrant and suprapubically. No rebound EXTREMITIES: Nontender, no edema, 2+ distal pulses. GU: visually closed. Ectropion. Mild white discharge. No CMT. No adnexal fullness but tenderness at the right adnexa FHT:  Unable to assess with doppler but visualized with bedside ultrasound at approximately 150 bpm Contractions: none   Labs: Results for orders placed or performed during the hospital encounter of 07/29/15 (from the past 24 hour(s))  Urinalysis, Routine w reflex microscopic ([facility])   Collection Time: 07/29/15  4:15 PM  Result Value Ref Range   Color, Urine YELLOW YELLOW   APPearance CLEAR CLEAR   Specific Gravity, Urine 1.025 1.005 - 1.030   pH 6.0 5.0 - 8.0   Glucose, UA NEGATIVE NEGATIVE mg/dL   Hgb urine dipstick NEGATIVE NEGATIVE   Bilirubin Urine NEGATIVE NEGATIVE   Ketones, ur NEGATIVE NEGATIVE mg/dL   Protein, ur NEGATIVE NEGATIVE mg/dL   Urobilinogen, UA 0.2 0.0 - 1.0 mg/dL   Nitrite NEGATIVE NEGATIVE   Leukocytes, UA NEGATIVE NEGATIVE  Wet prep, genital   Collection Time: 07/29/15  5:05 PM  Result Value Ref Range   Yeast Wet Prep HPF  POC NONE SEEN NONE SEEN   Trich, Wet Prep NONE SEEN NONE SEEN   Clue Cells Wet Prep HPF POC NONE SEEN NONE SEEN   WBC, Wet Prep HPF POC NONE SEEN NONE SEEN    Imaging Studies:  [place] Mfm Fetal Nuchal Translucency  07/22/2015  OBSTETRICAL ULTRASOUND: This exam was performed within a [facility]. The OB US report was generated in the [facility] system, and faxed to the ordering physician.  This report is available in the [employer]. See the [facility] Obstetric US report via the Image Link.   Assessment/Plan: Donna Mccullough is  24 y.o. G2P0010 at  [redacted]w[redacted]d presents with right pelvic pain. Patient is well appearing but does have tenderness in the right adnexa on pelvic exam. Urinalysis unremarkable, and so is wet prep. Initial plan to perform pelvic ultrasound to evaluate for ruptured ovarian cyst or other adnexal pathology, but the patient refuses to wait for ultrasound and requests discharge. We spoke at length about return precautions: worsening pain, fever, nausea/vomiting, vaginal bleeding, leakage of fluid, lightheadedness. Also on differential are more benign etiologies such as round ligament pain.   Also, will arrange for patient to present to our clinic at 15-[redacted] weeks gestational age for quad screen as first screen unable to be performed 2/2 fetal position.   Cherrie Gauze Michaela Shankel 10/14/20166:57 PM

## 2015-08-01 ENCOUNTER — Telehealth: Payer: Self-pay | Admitting: *Deleted

## 2015-08-01 LAB — GC/CHLAMYDIA PROBE AMP (~~LOC~~) NOT AT ARMC
Chlamydia: NEGATIVE
Neisseria Gonorrhea: NEGATIVE

## 2015-08-01 NOTE — Telephone Encounter (Signed)
Pt has OB f/u on 10-19, will address the need for quad screen at that visit.

## 2015-08-01 NOTE — Telephone Encounter (Signed)
-----   Message from Rae LipsAmanda A Rash, LPN sent at 16/10/960410/17/2016  2:44 PM EDT -----   ----- Message -----    From: Kathrynn RunningNoah Bedford Wouk, MD    Sent: 07/29/2015   7:03 PM      To: [facility]  This patient needs a quad screen. Please call her to help her schedule it some time between 15 and 18 weeks of gestation. Thanks, [employer]

## 2015-08-03 ENCOUNTER — Ambulatory Visit (INDEPENDENT_AMBULATORY_CARE_PROVIDER_SITE_OTHER): Payer: 59 | Admitting: Advanced Practice Midwife

## 2015-08-03 ENCOUNTER — Ambulatory Visit: Payer: 59 | Admitting: Dietician

## 2015-08-03 VITALS — BP 126/81 | HR 92 | Wt 258.0 lb

## 2015-08-03 DIAGNOSIS — R350 Frequency of micturition: Secondary | ICD-10-CM | POA: Diagnosis not present

## 2015-08-03 DIAGNOSIS — Z3402 Encounter for supervision of normal first pregnancy, second trimester: Secondary | ICD-10-CM

## 2015-08-03 DIAGNOSIS — O2342 Unspecified infection of urinary tract in pregnancy, second trimester: Secondary | ICD-10-CM | POA: Diagnosis not present

## 2015-08-03 DIAGNOSIS — R300 Dysuria: Secondary | ICD-10-CM

## 2015-08-03 DIAGNOSIS — O26892 Other specified pregnancy related conditions, second trimester: Secondary | ICD-10-CM

## 2015-08-03 LAB — POCT URINALYSIS DIPSTICK
Bilirubin, UA: NEGATIVE
Glucose, UA: NEGATIVE
KETONES UA: NEGATIVE
LEUKOCYTES UA: NEGATIVE
NITRITE UA: NEGATIVE
PH UA: 6.5
PROTEIN UA: NEGATIVE
Spec Grav, UA: <=1.005
Urobilinogen, UA: 0.2

## 2015-08-03 MED ORDER — PHENAZOPYRIDINE HCL 200 MG PO TABS: 200.0000 mg | ORAL_TABLET | Freq: Three times a day (TID) | ORAL | Status: DC | PRN

## 2015-08-03 MED ORDER — NITROFURANTOIN MONOHYD MACRO 100 MG PO CAPS: 100.0000 mg | ORAL_CAPSULE | Freq: Two times a day (BID) | ORAL | Status: DC

## 2015-08-03 NOTE — Progress Notes (Signed)
Subjective:  Donna Mccullough is a 24 y.o. G2P0010 at 222w0d being seen today for ongoing prenatal care.  Patient reports sharp suprapupic pain, urianry urgency, frequancy, but then not passing much urine . Was  Seen in MAU 07/29/15 for pain wrapping around from right low back to right groin. UA, Wet Prep, GC/Chlamydia neg. Informal US normal, buu declined to stay for formal imaging. No N/V/D/C, hematuria, VB or LOF.  Contractions: Not present.  Vag. Bleeding: None. Movement: Absent. Denies leaking of fluid.   The following portions of the patient's history were reviewed and updated as appropriate: allergies, current medications, past family history, past medical history, past social history, past surgical history and problem list. Problem list updated.  Objective:   Filed Vitals:   08/03/15 1534  BP: 126/81  Pulse: 92  Weight: 258 lb (117.028 kg)    Fetal Status: Fetal Heart Rate (bpm): + on us Fundal Height: 15 cm Movement: Absent     General:  Alert, oriented and cooperative. Patient is in no acute distress.  Skin: Skin is warm and dry. No rash noted.   Cardiovascular: Normal heart rate noted  Respiratory: Normal respiratory effort, no problems with respiration noted  Abdomen: Soft, gravid, appropriate for gestational age. Pain/Pressure: Present     Pelvic: Vag. Bleeding: None Vag D/C Character: Thin   Cervical exam deferred        Extremities: Normal range of motion.  Edema: None  Mental Status: Normal mood and affect. Normal behavior. Normal judgment and thought content.   Urinalysis: Urine Protein: Negative Urine Glucose: Negative Tr Hgb  Assessment and Plan:  Pregnancy: G2P0010 at 7622w0d  1. Encounter for supervision of normal first pregnancy in second trimester  2. Dysuria during pregnancy in second trimester   - POCT Urinalysis Dipstick - Culture, OB Urine - phenazopyridine (PYRIDIUM) 200 MG tablet; Take 1 tablet (200 mg total) by mouth 3 (three) times daily as needed for  pain (urethral spasm).  Dispense: 12 tablet; Refill: 0 - nitrofurantoin, macrocrystal-monohydrate, (MACROBID) 100 MG capsule; Take 1 capsule (100 mg total) by mouth 2 (two) times daily.  Dispense: 14 capsule; Refill: 1  3. Urinary frequency   - POCT Urinalysis Dipstick - Culture, OB Urine - phenazopyridine (PYRIDIUM) 200 MG tablet; Take 1 tablet (200 mg total) by mouth 3 (three) times daily as needed for pain (urethral spasm).  Dispense: 12 tablet; Refill: 0 - nitrofurantoin, macrocrystal-monohydrate, (MACROBID) 100 MG capsule; Take 1 capsule (100 mg total) by mouth 2 (two) times daily.  Dispense: 14 capsule; Refill: 1  4. UTI in pregnancy, second trimester  - POCT Urinalysis Dipstick - Culture, OB Urine - phenazopyridine (PYRIDIUM) 200 MG tablet; Take 1 tablet (200 mg total) by mouth 3 (three) times daily as needed for pain (urethral spasm).  Dispense: 12 tablet; Refill: 0 - nitrofurantoin, macrocrystal-monohydrate, (MACROBID) 100 MG capsule; Take 1 capsule (100 mg total) by mouth 2 (two) times daily.  Dispense: 14 capsule; Refill: 1   Preterm labor symptoms and general obstetric precautions including but not limited to vaginal bleeding, contractions, leaking of fluid and fetal movement were reviewed in detail with the patient. Please refer to After Visit Summary for other counseling recommendations.  Urine Culture Anatomy scan scheduled 07/31/15 Return in about 4 weeks (around 08/31/2015) for ROB and Quad.   Donna Mccullough, CNM

## 2015-08-03 NOTE — Patient Instructions (Signed)
Pregnancy and Urinary Tract Infection  A urinary tract infection (UTI) is a bacterial infection of the urinary tract. Infection of the urinary tract can include the ureters, kidneys (pyelonephritis), bladder (cystitis), and urethra (urethritis). All pregnant women should be screened for bacteria in the urinary tract. Identifying and treating a UTI will decrease the risk of preterm labor and developing more serious infections in both the mother and baby.  CAUSES  Bacteria germs cause almost all UTIs.   RISK FACTORS  Many factors can increase your chances of getting a UTI during pregnancy. These include:  · Having a short urethra.  · Poor toilet and hygiene habits.  · Sexual intercourse.  · Blockage of urine along the urinary tract.  · Problems with the pelvic muscles or nerves.  · Diabetes.  · Obesity.  · Bladder problems after having several children.  · Previous history of UTI.  SIGNS AND SYMPTOMS   · Pain, burning, or a stinging feeling when urinating.  · Suddenly feeling the need to urinate right away (urgency).  · Loss of bladder control (urinary incontinence).  · Frequent urination, more than is common with pregnancy.  · Lower abdominal or back discomfort.  · Cloudy urine.  · Blood in the urine (hematuria).  · Fever.   When the kidneys are infected, the symptoms may be:  · Back pain.  · Flank pain on the right side more so than the left.  · Fever.  · Chills.  · Nausea.  · Vomiting.  DIAGNOSIS   A urinary tract infection is usually diagnosed through urine tests. Additional tests and procedures are sometimes done. These may include:  · Ultrasound exam of the kidneys, ureters, bladder, and urethra.  · Looking in the bladder with a lighted tube (cystoscopy).  TREATMENT  Typically, UTIs can be treated with antibiotic medicines.   HOME CARE INSTRUCTIONS   · Only take over-the-counter or prescription medicines as directed by your health care provider. If you were prescribed antibiotics, take them as directed. Finish  them even if you start to feel better.  · Drink enough fluids to keep your urine clear or pale yellow.  · Do not have sexual intercourse until the infection is gone and your health care provider says it is okay.  · Make sure you are tested for UTIs throughout your pregnancy. These infections often come back.   Preventing a UTI in the Future  · Practice good toilet habits. Always wipe from front to back. Use the tissue only once.  · Do not hold your urine. Empty your bladder as soon as possible when the urge comes.  · Do not douche or use deodorant sprays.  · Wash with soap and warm water around the genital area and the anus.  · Empty your bladder before and after sexual intercourse.  · Wear underwear with a cotton crotch.  · Avoid caffeine and carbonated drinks. They can irritate the bladder.  · Drink cranberry juice or take cranberry pills. This may decrease the risk of getting a UTI.  · Do not drink alcohol.  · Keep all your appointments and tests as scheduled.   SEEK MEDICAL CARE IF:   · Your symptoms get worse.  · You are still having fevers 2 or more days after treatment begins.  · You have a rash.  · You feel that you are having problems with medicines prescribed.  · You have abnormal vaginal discharge.  SEEK IMMEDIATE MEDICAL CARE IF:   · You have back or flank   pain.  · You have chills.  · You have blood in your urine.  · You have nausea and vomiting.  · You have contractions of your uterus.  · You have a gush of fluid from the vagina.  MAKE SURE YOU:  · Understand these instructions.    · Will watch your condition.    · Will get help right away if you are not doing well or get worse.       This information is not intended to replace advice given to you by your health care provider. Make sure you discuss any questions you have with your health care provider.     Document Released: 01/26/2011 Document Revised: 07/22/2013 Document Reviewed: 04/30/2013  Elsevier Interactive Patient Education ©2016 Elsevier  Inc.

## 2015-08-04 LAB — CULTURE, OB URINE
Colony Count: NO GROWTH
ORGANISM ID, BACTERIA: NO GROWTH

## 2015-08-08 ENCOUNTER — Encounter: Payer: 59 | Admitting: Family Medicine

## 2015-08-08 ENCOUNTER — Telehealth: Payer: Self-pay | Admitting: *Deleted

## 2015-08-08 NOTE — Telephone Encounter (Signed)
Pt called requesting results of urine cx, informed her that urine cx was negative and showed no growth.  Pt currently taking Macrobid, instructed to continue and to finish all of the medication.  Pt is still experiencing discomfort with urination.  Trace of blood was noted on UA, discussed possible kidney stone and to finish antibiotic and push fluids.  Pt to call back in a few days to make appt if symptoms continue to persist.

## 2015-08-31 ENCOUNTER — Ambulatory Visit (HOSPITAL_COMMUNITY): Payer: 59

## 2015-09-07 ENCOUNTER — Encounter: Payer: 59 | Admitting: Advanced Practice Midwife

## 2015-10-16 NOTE — L&D Delivery Note (Signed)
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  Presentation: vertex; Position: Right,, Occiput,, Anterior; Station: +3.  Verbal consent: obtained from patient.  Risks and benefits discussed in detail.  Risks include, but are not limited to the risks of anesthesia, bleeding, infection, damage to maternal tissues, fetal cephalhematoma.  There is also the risk of inability to effect vaginal delivery of the head, or shoulder dystocia that cannot be resolved by established maneuvers, leading to the need for emergency cesarean section. Indication: Progressive deep variable decelerations  APGAR:7 , 9; weight pending  .   Placenta status: Intact, Spontaneous. Not sent   Cord:  CAN x 1 not reducible , clamped , cut 3 vessels with the following complications: Short.  Cord pH: none  Anesthesia: Epidural  Instruments: kiwi Episiotomy: None Lacerations:  Right Vaginal sulcus  Suture Repair: 3.0 chromic Est. Blood Loss (mL): 250  Mom to postpartum.  Baby to Couplet care / Skin to Skin.  Donna Mccullough A 01/19/2016, 3:26 AM

## 2015-10-18 ENCOUNTER — Other Ambulatory Visit: Payer: Self-pay

## 2015-10-18 ENCOUNTER — Ambulatory Visit (HOSPITAL_COMMUNITY)
Admission: RE | Admit: 2015-10-18 | Discharge: 2015-10-18 | Disposition: A | Discharge status: Home / Self Care | Payer: BLUE CROSS/BLUE SHIELD | Source: Ambulatory Visit | Attending: Obstetrics and Gynecology | Admitting: Obstetrics and Gynecology

## 2015-10-18 ENCOUNTER — Other Ambulatory Visit (HOSPITAL_COMMUNITY): Payer: Self-pay | Admitting: Obstetrics and Gynecology

## 2015-10-18 DIAGNOSIS — M79605 Pain in left leg: Secondary | ICD-10-CM

## 2015-10-18 DIAGNOSIS — M7989 Other specified soft tissue disorders: Secondary | ICD-10-CM | POA: Insufficient documentation

## 2015-10-18 NOTE — Progress Notes (Signed)
VASCULAR LAB PRELIMINARY  PRELIMINARY  PRELIMINARY  PRELIMINARY  Left lower extremity venous duplex completed.    Preliminary report:  Left:  No evidence of DVT, superficial thrombosis, or Baker's cyst.  Leahmarie Gasiorowski, RVS 10/18/2015, 7:16 PM

## 2015-11-09 ENCOUNTER — Encounter: Payer: 59 | Attending: Obstetrics and Gynecology

## 2015-11-09 VITALS — Ht 66.5 in | Wt 303.0 lb

## 2015-11-09 DIAGNOSIS — O99810 Abnormal glucose complicating pregnancy: Secondary | ICD-10-CM | POA: Insufficient documentation

## 2015-11-09 DIAGNOSIS — O24419 Gestational diabetes mellitus in pregnancy, unspecified control: Secondary | ICD-10-CM

## 2015-11-18 NOTE — Progress Notes (Signed)
  Patient was seen on 1/25/17for Gestational Diabetes self-management . The following learning objectives were met by the patient :   States the definition of Gestational Diabetes  States why dietary management is important in controlling blood glucose  Describes the effects of carbohydrates on blood glucose levels  Demonstrates ability to create a balanced meal plan  Demonstrates carbohydrate counting   States when to check blood glucose levels  Demonstrates proper blood glucose monitoring techniques  States the effect of stress and exercise on blood glucose levels  States the importance of limiting caffeine and abstaining from alcohol and smoking  Plan:  Aim for 2 Carb Choices per meal (30 grams) +/- 1 either way for breakfast Aim for 3 Carb Choices per meal (45 grams) +/- 1 either way from lunch and dinner Aim for 1-2 Carbs per snack Begin reading food labels for Total Carbohydrate and sugar grams of foods Consider  increasing your activity level by walking daily as tolerated Begin checking BG before breakfast and 1-2 hours after first bit of breakfast, lunch and dinner after  as directed by MD  Take medication  as directed by MD  Blood glucose monitor given: One Touch verio Flex Lot # [id number] X Exp: 07/2016 Blood glucose reading: 32m/dl  Patient instructed to monitor glucose levels: FBS: 60 - <90 1 hour: <140 2 hour: <120  Patient received the following handouts:  Nutrition Diabetes and Pregnancy  Carbohydrate Counting List  Meal Planning worksheet  Patient will be seen for follow-up as needed.

## 2016-01-06 ENCOUNTER — Other Ambulatory Visit: Payer: Self-pay | Admitting: Obstetrics and Gynecology

## 2016-01-13 ENCOUNTER — Telehealth (HOSPITAL_COMMUNITY): Payer: Self-pay | Admitting: *Deleted

## 2016-01-13 NOTE — Telephone Encounter (Signed)
Preadmission screen  

## 2016-01-18 ENCOUNTER — Inpatient Hospital Stay (HOSPITAL_COMMUNITY): Payer: BLUE CROSS/BLUE SHIELD | Admitting: Anesthesiology

## 2016-01-18 ENCOUNTER — Encounter (HOSPITAL_COMMUNITY): Payer: Self-pay

## 2016-01-18 ENCOUNTER — Inpatient Hospital Stay (HOSPITAL_COMMUNITY)
Admission: RE | Admit: 2016-01-18 | Discharge: 2016-01-21 | DRG: 775 | Disposition: A | Discharge status: Home / Self Care | Payer: BLUE CROSS/BLUE SHIELD | Source: Ambulatory Visit | Attending: Obstetrics and Gynecology | Admitting: Obstetrics and Gynecology

## 2016-01-18 VITALS — BP 144/86 | HR 64 | Temp 98.6°F | Resp 18 | Ht 65.0 in | Wt 263.0 lb

## 2016-01-18 DIAGNOSIS — Z3490 Encounter for supervision of normal pregnancy, unspecified, unspecified trimester: Secondary | ICD-10-CM

## 2016-01-18 DIAGNOSIS — Z833 Family history of diabetes mellitus: Secondary | ICD-10-CM

## 2016-01-18 DIAGNOSIS — Z3A39 39 weeks gestation of pregnancy: Secondary | ICD-10-CM

## 2016-01-18 DIAGNOSIS — O9081 Anemia of the puerperium: Secondary | ICD-10-CM | POA: Diagnosis not present

## 2016-01-18 DIAGNOSIS — O99214 Obesity complicating childbirth: Secondary | ICD-10-CM | POA: Diagnosis present

## 2016-01-18 DIAGNOSIS — Z6841 Body Mass Index (BMI) 40.0 and over, adult: Secondary | ICD-10-CM | POA: Diagnosis not present

## 2016-01-18 DIAGNOSIS — O24425 Gestational diabetes mellitus in childbirth, controlled by oral hypoglycemic drugs: Principal | ICD-10-CM | POA: Diagnosis present

## 2016-01-18 DIAGNOSIS — O24419 Gestational diabetes mellitus in pregnancy, unspecified control: Secondary | ICD-10-CM | POA: Diagnosis present

## 2016-01-18 DIAGNOSIS — D62 Acute posthemorrhagic anemia: Secondary | ICD-10-CM | POA: Diagnosis not present

## 2016-01-18 DIAGNOSIS — O99824 Streptococcus B carrier state complicating childbirth: Secondary | ICD-10-CM | POA: Diagnosis present

## 2016-01-18 DIAGNOSIS — O9962 Diseases of the digestive system complicating childbirth: Secondary | ICD-10-CM | POA: Diagnosis present

## 2016-01-18 DIAGNOSIS — Z8759 Personal history of other complications of pregnancy, childbirth and the puerperium: Secondary | ICD-10-CM

## 2016-01-18 DIAGNOSIS — B951 Streptococcus, group B, as the cause of diseases classified elsewhere: Secondary | ICD-10-CM

## 2016-01-18 DIAGNOSIS — D509 Iron deficiency anemia, unspecified: Secondary | ICD-10-CM | POA: Diagnosis present

## 2016-01-18 DIAGNOSIS — Z8249 Family history of ischemic heart disease and other diseases of the circulatory system: Secondary | ICD-10-CM | POA: Diagnosis not present

## 2016-01-18 DIAGNOSIS — O99211 Obesity complicating pregnancy, first trimester: Secondary | ICD-10-CM

## 2016-01-18 DIAGNOSIS — K219 Gastro-esophageal reflux disease without esophagitis: Secondary | ICD-10-CM | POA: Diagnosis present

## 2016-01-18 DIAGNOSIS — O24429 Gestational diabetes mellitus in childbirth, unspecified control: Secondary | ICD-10-CM | POA: Diagnosis present

## 2016-01-18 DIAGNOSIS — Z3402 Encounter for supervision of normal first pregnancy, second trimester: Secondary | ICD-10-CM

## 2016-01-18 DIAGNOSIS — O2341 Unspecified infection of urinary tract in pregnancy, first trimester: Secondary | ICD-10-CM

## 2016-01-18 HISTORY — DX: Personal history of other complications of pregnancy, childbirth and the puerperium: Z87.59

## 2016-01-18 LAB — GLUCOSE, CAPILLARY
GLUCOSE-CAPILLARY: 91 mg/dL (ref 65–99)
GLUCOSE-CAPILLARY: 67 mg/dL (ref 65–99)
Glucose-Capillary: 117 mg/dL — ABNORMAL HIGH (ref 65–99)
Glucose-Capillary: 53 mg/dL — ABNORMAL LOW (ref 65–99)
Glucose-Capillary: 64 mg/dL — ABNORMAL LOW (ref 65–99)
Glucose-Capillary: 123 mg/dL — ABNORMAL HIGH (ref 65–99)
Glucose-Capillary: 77 mg/dL (ref 65–99)

## 2016-01-18 LAB — CBC
HCT: 31.8 % — ABNORMAL LOW (ref 36.0–46.0)
Hemoglobin: 10.6 g/dL — ABNORMAL LOW (ref 12.0–15.0)
MCH: 28.3 pg (ref 26.0–34.0)
MCHC: 33.3 g/dL (ref 30.0–36.0)
MCV: 84.8 fL (ref 78.0–100.0)
PLATELETS: 168 10*3/uL (ref 150–400)
RBC: 3.75 MIL/uL — ABNORMAL LOW (ref 3.87–5.11)
RDW: 14.8 % (ref 11.5–15.5)
WBC: 5.4 10*3/uL (ref 4.0–10.5)

## 2016-01-18 LAB — TYPE AND SCREEN
ABO/RH(D): B POS
Antibody Screen: NEGATIVE

## 2016-01-18 LAB — ABO/RH: ABO/RH(D): B POS

## 2016-01-18 LAB — RPR: RPR: NONREACTIVE

## 2016-01-18 MED ORDER — LIDOCAINE HCL (PF) 1 % IJ SOLN
30.0000 mL | INTRAMUSCULAR | Status: DC | PRN
  Filled 2016-01-18: qty 30

## 2016-01-18 MED ORDER — OXYCODONE-ACETAMINOPHEN 5-325 MG PO TABS: 1.0000 | ORAL_TABLET | ORAL | Status: DC | PRN

## 2016-01-18 MED ORDER — FENTANYL 2.5 MCG/ML BUPIVACAINE 1/10 % EPIDURAL INFUSION (WH - ANES)
INTRAMUSCULAR | Status: AC
  Administered 2016-01-19: 14 mL/h via EPIDURAL
  Filled 2016-01-18: qty 125

## 2016-01-18 MED ORDER — NALBUPHINE HCL 10 MG/ML IJ SOLN
10.0000 mg | INTRAMUSCULAR | Status: DC | PRN
  Administered 2016-01-18: 10 mg via INTRAVENOUS
  Filled 2016-01-18: qty 1

## 2016-01-18 MED ORDER — LACTATED RINGERS IV SOLN: 500.0000 mL | Freq: Once | INTRAVENOUS | Status: DC

## 2016-01-18 MED ORDER — EPHEDRINE 5 MG/ML INJ
10.0000 mg | INTRAVENOUS | Status: DC | PRN
Start: 2016-01-18 — End: 2016-01-19
  Filled 2016-01-18: qty 2

## 2016-01-18 MED ORDER — OXYCODONE-ACETAMINOPHEN 5-325 MG PO TABS: 2.0000 | ORAL_TABLET | ORAL | Status: DC | PRN

## 2016-01-18 MED ORDER — PHENYLEPHRINE 40 MCG/ML (10ML) SYRINGE FOR IV PUSH (FOR BLOOD PRESSURE SUPPORT)
80.0000 ug | PREFILLED_SYRINGE | INTRAVENOUS | Status: DC | PRN
  Filled 2016-01-18: qty 2

## 2016-01-18 MED ORDER — EPHEDRINE 5 MG/ML INJ
10.0000 mg | INTRAVENOUS | Status: DC | PRN
  Filled 2016-01-18: qty 2

## 2016-01-18 MED ORDER — PENICILLIN G POTASSIUM 5000000 UNITS IJ SOLR
5.0000 10*6.[IU] | Freq: Once | INTRAVENOUS | Status: AC
  Administered 2016-01-18: 5 10*6.[IU] via INTRAVENOUS
  Filled 2016-01-18: qty 5

## 2016-01-18 MED ORDER — OXYTOCIN BOLUS FROM INFUSION: 500.0000 mL | INTRAVENOUS | Status: DC

## 2016-01-18 MED ORDER — OXYTOCIN 10 UNIT/ML IJ SOLN
1.0000 m[IU]/min | INTRAMUSCULAR | Status: DC
  Administered 2016-01-18: 1 m[IU]/min via INTRAVENOUS

## 2016-01-18 MED ORDER — LIDOCAINE HCL (PF) 1 % IJ SOLN
INTRAMUSCULAR | Status: DC | PRN
  Administered 2016-01-18 (×2): 4 mL via EPIDURAL

## 2016-01-18 MED ORDER — PHENYLEPHRINE 40 MCG/ML (10ML) SYRINGE FOR IV PUSH (FOR BLOOD PRESSURE SUPPORT)
PREFILLED_SYRINGE | INTRAVENOUS | Status: AC
  Filled 2016-01-18: qty 20

## 2016-01-18 MED ORDER — DIPHENHYDRAMINE HCL 50 MG/ML IJ SOLN: 12.5000 mg | INTRAMUSCULAR | Status: DC | PRN

## 2016-01-18 MED ORDER — PENICILLIN G POTASSIUM 5000000 UNITS IJ SOLR
2.5000 10*6.[IU] | INTRAVENOUS | Status: DC
  Administered 2016-01-18 – 2016-01-19 (×4): 2.5 10*6.[IU] via INTRAVENOUS
  Filled 2016-01-18 (×9): qty 2.5

## 2016-01-18 MED ORDER — LACTATED RINGERS IV SOLN: 500.0000 mL | INTRAVENOUS | Status: DC | PRN

## 2016-01-18 MED ORDER — FENTANYL 2.5 MCG/ML BUPIVACAINE 1/10 % EPIDURAL INFUSION (WH - ANES)
14.0000 mL/h | INTRAMUSCULAR | Status: DC | PRN
  Administered 2016-01-18 – 2016-01-19 (×2): 14 mL/h via EPIDURAL
  Filled 2016-01-18: qty 125

## 2016-01-18 MED ORDER — CITRIC ACID-SODIUM CITRATE 334-500 MG/5ML PO SOLN: 30.0000 mL | ORAL | Status: DC | PRN

## 2016-01-18 MED ORDER — OXYTOCIN 10 UNIT/ML IJ SOLN
2.5000 [IU]/h | INTRAVENOUS | Status: DC
  Administered 2016-01-19: 2.5 [IU]/h via INTRAVENOUS
  Filled 2016-01-18: qty 4

## 2016-01-18 MED ORDER — LACTATED RINGERS IV SOLN
INTRAVENOUS | Status: DC
  Administered 2016-01-18: 21:00:00 via INTRAUTERINE

## 2016-01-18 MED ORDER — NALBUPHINE HCL 10 MG/ML IJ SOLN
10.0000 mg | Freq: Four times a day (QID) | INTRAMUSCULAR | Status: DC | PRN
  Administered 2016-01-18: 10 mg via INTRAMUSCULAR
  Filled 2016-01-18: qty 1

## 2016-01-18 MED ORDER — ACETAMINOPHEN 325 MG PO TABS: 650.0000 mg | ORAL_TABLET | ORAL | Status: DC | PRN

## 2016-01-18 MED ORDER — DEXTROSE 5 % IN LACTATED RINGERS IV BOLUS
500.0000 mL | Freq: Once | INTRAVENOUS | Status: AC
  Administered 2016-01-18: 500 mL via INTRAVENOUS

## 2016-01-18 MED ORDER — TERBUTALINE SULFATE 1 MG/ML IJ SOLN
0.2500 mg | Freq: Once | INTRAMUSCULAR | Status: DC | PRN
  Filled 2016-01-18: qty 1

## 2016-01-18 MED ORDER — LACTATED RINGERS IV SOLN
INTRAVENOUS | Status: DC
  Administered 2016-01-18 (×2): via INTRAVENOUS

## 2016-01-18 MED ORDER — ONDANSETRON HCL 4 MG/2ML IJ SOLN
4.0000 mg | Freq: Four times a day (QID) | INTRAMUSCULAR | Status: DC | PRN
  Administered 2016-01-19: 4 mg via INTRAVENOUS
  Filled 2016-01-18: qty 2

## 2016-01-18 NOTE — Progress Notes (Signed)
S: requesting epidural Balloon out S/p Nubain for pain (+) vomited x 1  O: Pitocin 16 miu BP 134/84 mmHg  Pulse 76  Temp(Src) 98 F (36.7 C) (Oral)  Resp 18  Ht 5\' 5"  (1.651 m)  Wt 119.296 kg (263 lb)  BMI 43.77 kg/m2  LMP 04/20/2015 VE  4/80/-3/-2 bulging membrane AROM clear fluid. IUPC/ISE placed  Tracing; baseline 140 (+) early decel min variability Ctx q 2mins  IMP: latent/active phase GBS cx (+) on IV PCN Class A2 GDM Term gestation P) epidural. Cont pitocin. Cont IV PCN. Exaggerated sims position

## 2016-01-18 NOTE — Anesthesia Procedure Notes (Signed)
Epidural Patient location during procedure: OB Start time: 01/18/2016 8:00 PM  Staffing Anesthesiologist: Mal AmabileFOSTER, Taevion Sikora Performed by: anesthesiologist   Preanesthetic Checklist Completed: patient identified, site marked, surgical consent, pre-op evaluation, timeout performed, IV checked, risks and benefits discussed and monitors and equipment checked  Epidural Patient position: sitting Prep: site prepped and draped and DuraPrep Patient monitoring: continuous pulse ox and blood pressure Approach: midline Location: L3-L4 Injection technique: LOR air  Needle:  Needle type: Tuohy  Needle gauge: 17 G Needle length: 9 cm and 9 Needle insertion depth: 8 cm Catheter type: closed end flexible Catheter size: 19 Gauge Catheter at skin depth: 14 cm Test dose: negative and Other  Assessment Events: blood not aspirated, injection not painful, no injection resistance, negative IV test and no paresthesia  Additional Notes Patient identified. Risks and benefits discussed including failed block, incomplete  Pain control, post dural puncture headache, nerve damage, paralysis, blood pressure Changes, nausea, vomiting, reactions to medications-both toxic and allergic and post Partum back pain. All questions were answered. Patient expressed understanding and wished to proceed. Sterile technique was used throughout procedure. Epidural site was Dressed with sterile barrier dressing. No paresthesias, signs of intravascular injection Or signs of intrathecal spread were encountered.  Patient was more comfortable after the epidural was dosed. Please see RN's note for documentation of vital signs and FHR which are stable.

## 2016-01-18 NOTE — H&P (Signed)
Donna Mccullough is a 25 y.o. female presenting @ 5539 weeks gestation for IOL 2nd to Class A2 GDM. (+) GBS intact membrane  Maternal Medical History:  Fetal activity: Perceived fetal activity is normal.    Prenatal Complications - Diabetes: gestational. Diabetes is managed by oral agent (monotherapy).      OB History    Gravida Para Term Preterm AB TAB SAB Ectopic Multiple Living   2    1 1          Past Medical History  Diagnosis Date  . Elevated blood pressure reading     10/12/11 141/95  09/25/13 140/85  Initial BP at [redacted] weeks GA 139/84  . Gestational diabetes mellitus (GDM), antepartum    Past Surgical History  Procedure Laterality Date  . Breast reduction surgery Bilateral 2014   Family History: family history includes Cancer in her maternal grandfather and paternal grandfather; Diabetes in her paternal grandfather; Hyperlipidemia in her paternal grandfather; Hypertension in her mother and paternal grandfather; Ulcers in her mother. Social History:  reports that she has never smoked. She has never used smokeless tobacco. She reports that she does not drink alcohol or use illicit drugs.   Prenatal Transfer Tool  Maternal Diabetes: Yes:  Diabetes Type:  Insulin/Medication controlled Genetic Screening: Normal Maternal Ultrasounds/Referrals: Normal Fetal Ultrasounds or other Referrals:  None Maternal Substance Abuse:  No Significant Maternal Medications:  Meds include: Other: glyburide Significant Maternal Lab Results:  Lab values include: Group B Strep positive Other Comments:  Transferred care from [facility].   Sono: SGA baby  Review of Systems  All other systems reviewed and are negative.   Dilation: Fingertip Effacement (%): 60 Station: -2 Exam by:: k fields, rn Blood pressure 133/86, pulse 79, temperature 98.2 F (36.8 C), temperature source Oral, resp. rate 18, height 5\' 5"  (1.651 m), weight 119.296 kg (263 lb), last menstrual period 04/20/2015. Exam Physical Exam   Constitutional: She is oriented to person, place, and time. She appears well-developed and well-nourished.  Eyes: EOM are normal.  Neck: Neck supple.  Cardiovascular: Regular rhythm.   Respiratory: Breath sounds normal.  GI: Soft.  Musculoskeletal: She exhibits edema.  Neurological: She is alert and oriented to person, place, and time.  Skin: Skin is warm and dry.  Psychiatric: She has a normal mood and affect.   VE 1/60/-3. Intracervical balloon placed.  Prenatal labs: ABO, Rh: B/POS/-- (08/29 1615) Antibody: NEG (08/29 1615) Rubella: 3.91 (08/29 1615) RPR: NON REAC (08/29 1615)  HBsAg: NEGATIVE (08/29 1615)  HIV: NONREACTIVE (08/29 1615)  GBS: Positive (10/14 0000)   Assessment/Plan: Class A2 GDM Term gestation GBS cx (+) P) admit routine labs. BS q 2 hrs. Pitocin. IV PCN. Intracervical balloon done. Amniotomy prn. Analgesic/epidural prn   Donna Mccullough A 01/18/2016, 9:45 AM

## 2016-01-18 NOTE — Anesthesia Preprocedure Evaluation (Addendum)
Anesthesia Evaluation  Patient identified by MRN, date of birth, ID band Patient awake    Reviewed: Allergy & Precautions, NPO status , Patient's Chart, lab work & pertinent test results  Airway Mallampati: III  TM Distance: >3 FB Neck ROM: Full    Dental no notable dental hx. (+) Teeth Intact   Pulmonary neg pulmonary ROS,    Pulmonary exam normal breath sounds clear to auscultation       Cardiovascular Normal cardiovascular exam Rhythm:Regular Rate:Normal     Neuro/Psych  Headaches, negative psych ROS   GI/Hepatic Neg liver ROS, GERD  Medicated and Controlled,  Endo/Other  diabetes, Well Controlled, Gestational, Oral Hypoglycemic AgentsMorbid obesity  Renal/GU negative Renal ROS  negative genitourinary   Musculoskeletal negative musculoskeletal ROS (+)   Abdominal (+) + obese,   Peds  Hematology  (+) anemia ,   Anesthesia Other Findings   Reproductive/Obstetrics (+) Pregnancy                            Anesthesia Physical Anesthesia Plan  ASA: III  Anesthesia Plan: Epidural   Post-op Pain Management:    Induction:   Airway Management Planned: Natural Airway  Additional Equipment:   Intra-op Plan:   Post-operative Plan:   Informed Consent: I have reviewed the patients History and Physical, chart, labs and discussed the procedure including the risks, benefits and alternatives for the proposed anesthesia with the patient or authorized representative who has indicated his/her understanding and acceptance.     Plan Discussed with: Anesthesiologist  Anesthesia Plan Comments:         Anesthesia Quick Evaluation

## 2016-01-19 ENCOUNTER — Encounter (HOSPITAL_COMMUNITY): Payer: Self-pay

## 2016-01-19 DIAGNOSIS — O24419 Gestational diabetes mellitus in pregnancy, unspecified control: Secondary | ICD-10-CM | POA: Diagnosis present

## 2016-01-19 DIAGNOSIS — Z8759 Personal history of other complications of pregnancy, childbirth and the puerperium: Secondary | ICD-10-CM

## 2016-01-19 DIAGNOSIS — O24429 Gestational diabetes mellitus in childbirth, unspecified control: Secondary | ICD-10-CM | POA: Diagnosis present

## 2016-01-19 HISTORY — DX: Personal history of other complications of pregnancy, childbirth and the puerperium: Z87.59

## 2016-01-19 LAB — CCBB MATERNAL DONOR DRAW

## 2016-01-19 MED ORDER — LANOLIN HYDROUS EX OINT: TOPICAL_OINTMENT | CUTANEOUS | Status: DC | PRN

## 2016-01-19 MED ORDER — FERROUS SULFATE 325 (65 FE) MG PO TABS
325.0000 mg | ORAL_TABLET | Freq: Two times a day (BID) | ORAL | Status: DC
  Administered 2016-01-19 – 2016-01-21 (×5): 325 mg via ORAL
  Filled 2016-01-19 (×5): qty 1

## 2016-01-19 MED ORDER — ONDANSETRON HCL 4 MG PO TABS: 4.0000 mg | ORAL_TABLET | ORAL | Status: DC | PRN

## 2016-01-19 MED ORDER — SIMETHICONE 80 MG PO CHEW
80.0000 mg | CHEWABLE_TABLET | ORAL | Status: DC | PRN
Start: 2016-01-19 — End: 2016-01-21

## 2016-01-19 MED ORDER — WITCH HAZEL-GLYCERIN EX PADS: 1.0000 "application " | MEDICATED_PAD | CUTANEOUS | Status: DC | PRN

## 2016-01-19 MED ORDER — SENNOSIDES-DOCUSATE SODIUM 8.6-50 MG PO TABS
2.0000 | ORAL_TABLET | ORAL | Status: DC
  Administered 2016-01-20 – 2016-01-21 (×2): 2 via ORAL
  Filled 2016-01-19 (×2): qty 2

## 2016-01-19 MED ORDER — ZOLPIDEM TARTRATE 5 MG PO TABS: 5.0000 mg | ORAL_TABLET | Freq: Every evening | ORAL | Status: DC | PRN

## 2016-01-19 MED ORDER — BENZOCAINE-MENTHOL 20-0.5 % EX AERO
1.0000 "application " | INHALATION_SPRAY | CUTANEOUS | Status: DC | PRN
  Administered 2016-01-19: 1 via TOPICAL
  Filled 2016-01-19: qty 56

## 2016-01-19 MED ORDER — DIBUCAINE 1 % RE OINT: 1.0000 "application " | TOPICAL_OINTMENT | RECTAL | Status: DC | PRN

## 2016-01-19 MED ORDER — PRENATAL MULTIVITAMIN CH
1.0000 | ORAL_TABLET | Freq: Every day | ORAL | Status: DC
  Administered 2016-01-19 – 2016-01-21 (×3): 1 via ORAL
  Filled 2016-01-19 (×3): qty 1

## 2016-01-19 MED ORDER — ACETAMINOPHEN 325 MG PO TABS
650.0000 mg | ORAL_TABLET | ORAL | Status: DC | PRN
  Administered 2016-01-19: 650 mg via ORAL
  Filled 2016-01-19: qty 2

## 2016-01-19 MED ORDER — ONDANSETRON HCL 4 MG/2ML IJ SOLN: 4.0000 mg | INTRAMUSCULAR | Status: DC | PRN

## 2016-01-19 MED ORDER — DIPHENHYDRAMINE HCL 25 MG PO CAPS: 25.0000 mg | ORAL_CAPSULE | Freq: Four times a day (QID) | ORAL | Status: DC | PRN

## 2016-01-19 MED ORDER — IBUPROFEN 600 MG PO TABS
600.0000 mg | ORAL_TABLET | Freq: Four times a day (QID) | ORAL | Status: DC
Start: 2016-01-19 — End: 2016-01-21
  Administered 2016-01-19 – 2016-01-21 (×10): 600 mg via ORAL
  Filled 2016-01-19 (×10): qty 1

## 2016-01-19 NOTE — Progress Notes (Signed)
Donna Mccullough is a 25 y.o. G2P0010 at 10368w1d by LMP admitted for induction of labor due to Gestational diabetes.  Subjective: No chief complaint on file. c/o intermittent rectal pressure  Objective: Epidural Pitocin 16 BP 139/93 mmHg  Pulse 85  Temp(Src) 98.3 F (36.8 C) (Oral)  Resp 18  Ht 5\' 5"  (1.651 m)  Wt 119.296 kg (263 lb)  BMI 43.77 kg/m2  SpO2 100%  LMP 04/20/2015      CBG (last 3)   Recent Labs  01/18/16 1703 01/18/16 2014 01/18/16 2215  GLUCAP 91 77 67      FHT:  FHR: 140 bpm, variability: moderate,  accelerations:  Present,  decelerations:  Present early UC:   regular, every 2-4 minutes SVE:   9 cm dilated, 100% effaced, 0 station Tracing: cat 1  Labs: Lab Results  Component Value Date   WBC 5.4 01/18/2016   HGB 10.6* 01/18/2016   HCT 31.8* 01/18/2016   MCV 84.8 01/18/2016   PLT 168 01/18/2016   CBG (last 3)   Recent Labs  01/18/16 1703 01/18/16 2014 01/18/16 2215  GLUCAP 91 77 67    Assessment / Plan: Induction of labor due to gestational diabetes,  progressing well on pitocin GBS cx (+)  Term P) cont present mgmt  Anticipated MOD:  NSVD  Chasyn Cinque A 01/19/2016, 12:48 AM

## 2016-01-19 NOTE — Lactation Note (Signed)
This note was copied from a baby's chart. Lactation Consultation Note New mom had beast augmentation 2014. Mom has flat nipples. Noted Keloid scaring around areola bilaterally. Hand expression taught. No colostrum noted. Mom shown how to use DEBP & how to disassemble, clean, & reassemble parts. #21 flanges given d/t small nipples.  Mom knows to pump q3h for 15-20 min. Mom encouraged to feed baby 8-12 times/24 hours and with feeding cues. Mom encouraged to waken baby for feeds. Baby is 5.13lbs. Mom given supplementation information feeding sheet. Similac 22 cal. Given. Baby not interested in BF or bottle feeding. Mom is breast bottle. Mom understands that w/augmentation and scaring, she may have low milk supply. Mom encouraged to do skin-to-skin. Mom reports no breast changes w/pregnancy. Referred to Baby and Me Book in Breastfeeding section Pg. 22-23 for position options and Proper latch demonstration. Mom fitted w/#16 NS d/t flat nipples and areola not compressible. Mom taught how to apply & clean nipple shield. Will need assistance in latching when baby is interested in BF. Reviewed and demonstrated latching w/"C" hold and football position. Educated about newborn behavior, I&O, supply and demand. Encouraged to call for assistance if needed and to verify proper latch. Mom placed in shells and encouraged to wear them between feedings. WH/LC brochure given w/resources, support groups and LC services. Patient Name: [name] [id number]'s Date: 01/19/2016 Reason for consult: Initial assessment   Maternal Data Has patient been taught Hand Expression?: Yes Does the patient have breastfeeding experience prior to this delivery?: No  Feeding Feeding Type: Bottle Fed - Formula Nipple Type: Slow - flow  LATCH Score/Interventions       Type of Nipple: Flat Intervention(s): Double electric pump;Shells;Hand pump  Comfort (Breast/Nipple): Soft / non-tender           Lactation Tools  Discussed/Used Tools: Shells;Nipple Shields;Pump;Flanges;Bottle Nipple shield size: 16 Flange Size: Other (comment) (21) Shell Type: Inverted Breast pump type: Double-Electric Breast Pump WIC Program: No Pump Review: Setup, frequency, and cleaning;Milk Storage Initiated by:: Peri JeffersonL. Suzette Flagler RN Date initiated:: 01/19/16   Consult Status Consult Status: Follow-up Date: 01/20/16 Follow-up type: In-patient    Charyl DancerCARVER, Audria Takeshita G 01/19/2016, 6:39 AM

## 2016-01-19 NOTE — Progress Notes (Signed)
Patient ID: Donna Mccullough, female   DOB: 08/10/1991, 25 y.o.   MRN: [id number] INTERVAL NOTE: S/P Vacuum-Assisted Vaginal Delivery   S:   Sitting in bed, infant skin-to-skin, min cramping, (+) voids, small bleed, denies HA/NV/dizziness  O:   VSS, AAO x 3, NAD  U-even  Scant lochia  A / P:   PPD #0  Stable post partum  Routine PP orders  Donna Mccullough, Donna Mccullough, M, MSN, CNM 01/19/2016, 9:35 AM

## 2016-01-19 NOTE — Anesthesia Postprocedure Evaluation (Signed)
Anesthesia Post Note  Patient: Donna Mccullough  Procedure(s) Performed: * No procedures listed *  Patient location during evaluation: Mother Baby Anesthesia Type: Epidural Level of consciousness: awake and alert Pain management: pain level controlled Vital Signs Assessment: post-procedure vital signs reviewed and stable Respiratory status: spontaneous breathing, nonlabored ventilation and respiratory function stable Cardiovascular status: stable Postop Assessment: no headache, no backache and epidural receding Anesthetic complications: no    Last Vitals:  Filed Vitals:   01/19/16 0500 01/19/16 0600  BP: 137/72 137/74  Pulse: 68 61  Temp: 36.8 C 37.1 C  Resp: 20 18    Last Pain:  Filed Vitals:   01/19/16 0621  PainSc: 0-No pain                 Faylynn Stamos

## 2016-01-20 LAB — CBC
HCT: 24.9 % — ABNORMAL LOW (ref 36.0–46.0)
Hemoglobin: 8.3 g/dL — ABNORMAL LOW (ref 12.0–15.0)
MCH: 28.4 pg (ref 26.0–34.0)
MCHC: 33.3 g/dL (ref 30.0–36.0)
MCV: 85.3 fL (ref 78.0–100.0)
PLATELETS: 142 10*3/uL — AB (ref 150–400)
RBC: 2.92 MIL/uL — AB (ref 3.87–5.11)
RDW: 15.3 % (ref 11.5–15.5)
WBC: 8.2 10*3/uL (ref 4.0–10.5)

## 2016-01-20 NOTE — Progress Notes (Signed)
PPD 1 VAVD with vaginal laceration repair  S:  Reports feeling well - little sore             Tolerating po/ No nausea or vomiting             Bleeding is moderate             Pain controlled with motrin              Up ad lib / ambulatory / voiding QS  Newborn breast & formula feeding    O:               VS: BP 127/79 mmHg  Pulse 70  Temp(Src) 98.3 F (36.8 C) (Oral)  Resp 20  Ht 5\' 5"  (1.651 m)  Wt 119.296 kg (263 lb)  BMI 43.77 kg/m2  SpO2 99%  LMP 04/20/2015  Breastfeeding? Unknown   LABS:              Recent Labs  01/18/16 0800 01/20/16 0602  WBC 5.4 8.2  HGB 10.6* 8.3*  PLT 168 142*               Blood type: --/--/B POS, B POS (04/05 0800)  Rubella: 3.91 (08/29 1615)                          Physical Exam:             Alert and oriented X3  Abdomen: soft, non-tender, non-distended              Fundus: firm, non-tender, U-1  Perineum: moderate edema / ice pack in place  Lochia: moderate  Extremities: trace edema, no calf pain or tenderness    A: PPD # 1 VAVD with vaginal repair             IDA of pregnancy with compounded ABL   Doing well - stable status  P: Routine post partum orders   Marlinda MikeBAILEY, TANYA CNM, MSN, [facility] CenterFACNM 01/20/2016, 8:02 AM

## 2016-01-20 NOTE — Lactation Note (Signed)
This note was copied from a baby's chart. Lactation Consultation Note  Patient Name: Girl Bonna Gainsleisha Roggenkamp MVHQI'OToday's Date: 01/20/2016 Reason for consult: Follow-up assessment Mom has history of breast reduction, flat nipples, baby not latching. She was set up with DEBP but has not been pumping, bottle/formula feeding. Mom has DEBP at home but still deciding about pumping to provide breast milk. LC stressed to Mom the importance of pumping every 3 hours for 15-20 minutes to encourage milk production if she wants to provide breast milk. Mom is not trying to latch at this time. Mom still undecided but LC reviewed pump settings with Mom and advised LC would check on her tomorrow for recommendations for d/c. Mom to call for questions/concerns.   Maternal Data    Feeding Feeding Type: Bottle Fed - Formula  LATCH Score/Interventions                      Lactation Tools Discussed/Used Tools: Pump Breast pump type: Double-Electric Breast Pump   Consult Status Consult Status: Follow-up Date: 01/21/16 Follow-up type: In-patient    Alfred LevinsGranger, Daionna Crossland Ann 01/20/2016, 3:14 PM

## 2016-01-20 NOTE — Clinical Documentation Improvement (Signed)
OB/GYN  Abnormal Lab/Test Results:  01/18/16: H/H= 10.6/31.8; 01/20/16: 8.3/24.9  Possible Clinical Conditions associated with below indicators  Acute Blood Loss Anemia  Iron Deficiency Anemia  Other Condition  Cannot Clinically Determine   Supporting Information: 01/19/16: VD- EBL= 250 cc  Treatment Provided: Ferrous Sulfate 325 mg po BID with meals   Please exercise your independent, professional judgment when responding. A specific answer is not anticipated or expected.   Thank You,  Cherylann Ratelonna J Giani Winther, RN, BSN Health Information Management [place] [phone]

## 2016-01-21 MED ORDER — IBUPROFEN 600 MG PO TABS: 600.0000 mg | ORAL_TABLET | Freq: Four times a day (QID) | ORAL | Status: DC

## 2016-01-21 MED ORDER — MAGNESIUM OXIDE 400 MG PO TABS: 400.0000 mg | ORAL_TABLET | Freq: Every day | ORAL | Status: DC

## 2016-01-21 MED ORDER — POLYSACCHARIDE IRON COMPLEX 150 MG PO CAPS: 150.0000 mg | ORAL_CAPSULE | Freq: Every day | ORAL | Status: DC

## 2016-01-21 NOTE — Discharge Summary (Signed)
Obstetric Discharge Summary  Reason for Admission: induction of labor - GDMa2 Prenatal Procedures: NST and ultrasound Intrapartum Procedures: vacuum, GBS prophylaxis and epidural Postpartum Procedures: none Complications-Operative and Postpartum: vaginal laceration HEMOGLOBIN  Date Value Ref Range Status  01/20/2016 8.3* 12.0 - 15.0 g/dL Final    Comment:    REPEATED TO VERIFY DELTA CHECK NOTED    HCT  Date Value Ref Range Status  01/20/2016 24.9* 36.0 - 46.0 % Final    Physical Exam:  General: alert, cooperative and no distress Lochia: appropriate Uterine Fundus: firm DVT Evaluation: No evidence of DVT seen on physical exam.  Discharge Diagnoses: Term Pregnancy-delivered , Class A2 GDM  Discharge Information: Date: 01/21/2016 Activity: pelvic rest Diet: routine Medications: PNV and Motrin Condition: stable Instructions: refer to practice specific booklet Discharge to: home Follow-up Information    Follow up with Giavonni Fonder A, MD. Schedule an appointment as soon as possible for a visit in 1 week.   Specialty:  Obstetrics and Gynecology   Why:  blood pressure recheck with nurse   Contact information:   [address] [place] [id number] [phone]       Newborn Data: Live born female  Birth Weight: 5 lb 13.8 oz (2660 g) APGAR: 7, 9  Home with mother.  Marlinda MikeBAILEY, TANYA 01/21/2016, 12:26 PM

## 2016-01-21 NOTE — Lactation Note (Signed)
This note was copied from a baby's chart. Lactation Consultation Note  Patient Name: Donna Mccullough ZOXWR'UToday's Date: 01/21/2016 Reason for consult: Follow-up assessment  Baby 55 hours old. Mom reports that she is still not pumping but understands supply and demand. Maternal Data    Feeding Feeding Type: Breast Fed  LATCH Score/Interventions                      Lactation Tools Discussed/Used     Consult Status Consult Status: Complete    Nancy NordmannWILLIARD, Varie Machamer 01/21/2016, 10:35 AM

## 2016-01-21 NOTE — Progress Notes (Addendum)
   01/21/16 0928  2nd Pain Site  Pain Location Leg (L shin )  Pain Onset On-going  Pain Orientation Left  Pain Descriptors / Indicators Tender  Pain Frequency Constant (started on 01/20/16 @19 :00 pain 6 with palpation )  negative Homan's  Sign, all other vascular WDL

## 2016-01-21 NOTE — Progress Notes (Signed)
  S:  Reports feeling well & ready to go home             Tolerating po/ No nausea or vomiting             Bleeding is light             Pain controlled with motrin             Up ad lib / ambulatory / voiding QS  Newborn breast feeding   O:               VS: BP 144/86 mmHg  Pulse 64  Temp(Src) 98.6 F (37 C) (Oral)  Resp 18  Ht 5\' 5"  (1.651 m)  Wt 119.296 kg (263 lb)  BMI 43.77 kg/m2  SpO2 99%  LMP 04/20/2015  Breastfeeding? Unknown   LABS:              Recent Labs  01/20/16 0602  WBC 8.2  HGB 8.3*  PLT 142*               Blood type: --/--/B POS, B POS (04/05 0800)  Rubella: 3.91 (08/29 1615)                                 Physical Exam:             Alert and oriented X3  Abdomen: soft, non-tender, non-distended              Fundus: firm, non-tender, U-1  Perineum: no edema  Lochia: light  Extremities: trace pedal edema, no calf pain or tenderness    A: PPD # 2   Doing well - stable status  P: Routine post partum orders  DC home  Donna Mccullough, Donna Mccullough CNM, MSN, [facility] 01/21/2016, 12:22 PM

## 2016-01-26 ENCOUNTER — Inpatient Hospital Stay (HOSPITAL_COMMUNITY): Payer: BLUE CROSS/BLUE SHIELD

## 2016-01-26 ENCOUNTER — Encounter (HOSPITAL_COMMUNITY): Payer: Self-pay | Admitting: *Deleted

## 2016-01-26 ENCOUNTER — Inpatient Hospital Stay (HOSPITAL_COMMUNITY)
Admission: AD | Admit: 2016-01-26 | Discharge: 2016-01-26 | Disposition: A | Discharge status: Home / Self Care | Payer: BLUE CROSS/BLUE SHIELD | Source: Ambulatory Visit | Attending: Obstetrics and Gynecology | Admitting: Obstetrics and Gynecology

## 2016-01-26 DIAGNOSIS — O165 Unspecified maternal hypertension, complicating the puerperium: Secondary | ICD-10-CM

## 2016-01-26 DIAGNOSIS — R030 Elevated blood-pressure reading, without diagnosis of hypertension: Secondary | ICD-10-CM | POA: Diagnosis present

## 2016-01-26 DIAGNOSIS — O135 Gestational [pregnancy-induced] hypertension without significant proteinuria, complicating the puerperium: Secondary | ICD-10-CM | POA: Insufficient documentation

## 2016-01-26 LAB — URINALYSIS, ROUTINE W REFLEX MICROSCOPIC
Bilirubin Urine: NEGATIVE
Glucose, UA: NEGATIVE mg/dL
KETONES UR: NEGATIVE mg/dL
NITRITE: NEGATIVE
PH: 5.5 (ref 5.0–8.0)
Protein, ur: NEGATIVE mg/dL

## 2016-01-26 LAB — CBC
HCT: 31.1 % — ABNORMAL LOW (ref 36.0–46.0)
HEMOGLOBIN: 10 g/dL — AB (ref 12.0–15.0)
MCH: 27.8 pg (ref 26.0–34.0)
MCHC: 32.2 g/dL (ref 30.0–36.0)
MCV: 86.4 fL (ref 78.0–100.0)
PLATELETS: 238 10*3/uL (ref 150–400)
RBC: 3.6 MIL/uL — ABNORMAL LOW (ref 3.87–5.11)
RDW: 15.1 % (ref 11.5–15.5)
WBC: 5.5 10*3/uL (ref 4.0–10.5)

## 2016-01-26 LAB — URIC ACID: URIC ACID, SERUM: 4.6 mg/dL (ref 2.3–6.6)

## 2016-01-26 LAB — PROTEIN / CREATININE RATIO, URINE: CREATININE, URINE: 61 mg/dL

## 2016-01-26 LAB — URINE MICROSCOPIC-ADD ON

## 2016-01-26 LAB — LACTATE DEHYDROGENASE: LDH: 196 U/L — AB (ref 98–192)

## 2016-01-26 MED ORDER — LABETALOL HCL 100 MG PO TABS: 100.0000 mg | ORAL_TABLET | Freq: Two times a day (BID) | ORAL | Status: DC

## 2016-01-26 NOTE — Discharge Instructions (Signed)
Call if temperature greater than equal to 100.4, nothing per vagina for 4-6 weeks or severe nausea vomiting, increased incisional pain , drainage or redness in the incision site, no straining with bowel movements, showers no bath °

## 2016-01-26 NOTE — MAU Provider Note (Signed)
History    25 yo G2P1011 BF S/ P VD 4/6 sent from office for laboratory evaluation of complaint of chest pain/back pain and elevated BP. Pt denies leg swelling, h/a, visual changes. She notes back pain from the epidural site radiating up ward a And pain in epigastric area radiating to back. No palpitation or SOB  OB History    Gravida Para Term Preterm AB TAB SAB Ectopic Multiple Living   0 1      Past Medical History  Diagnosis Date  . Elevated blood pressure reading     10/12/11 141/95  09/25/13 140/85  Initial BP at [redacted] weeks GA 139/84  . Gestational diabetes mellitus (GDM), antepartum   . Status post vacuum-assisted vaginal delivery (4/6) 01/19/2016    Past Surgical History  Procedure Laterality Date  . Breast reduction surgery Bilateral 2014    Family History  Problem Relation Age of Onset  . Hypertension Mother   . Ulcers Mother   . Cancer Maternal Grandfather     prostate  . Cancer Paternal Grandfather     prostate  . Diabetes Paternal Grandfather   . Hypertension Paternal Grandfather   . Hyperlipidemia Paternal Grandfather     Social History  Substance Use Topics  . Smoking status: Never Smoker   . Smokeless tobacco: Never Used  . Alcohol Use: No     Comment: socially    Allergies: No Known Allergies  Prescriptions prior to admission  Medication Sig Dispense Refill Last Dose  . ferrous sulfate 325 (65 FE) MG tablet Take 325 mg by mouth daily with breakfast.   01/25/2016 at Unknown time  . ibuprofen (ADVIL,MOTRIN) 200 MG tablet Take 400 mg by mouth every 6 (six) hours as needed for mild pain or moderate pain.   01/25/2016 at Unknown time  . ibuprofen (ADVIL,MOTRIN) 600 MG tablet Take 1 tablet (600 mg total) by mouth every 6 (six) hours. (Patient not taking: Reported on 01/26/2016) 30 tablet 0   . iron polysaccharides (NIFEREX) 150 MG capsule Take 1 capsule (150 mg total) by mouth daily. (Patient not taking: Reported on 01/26/2016) 45 capsule 0   .  magnesium oxide (MAG-OX) 400 MG tablet Take 1 tablet (400 mg total) by mouth daily. (Patient not taking: Reported on 01/26/2016) 45 tablet 0      Physical Exam   Blood pressure 141/89, pulse 79, temperature 98.2 F (36.8 C), resp. rate 18, height  (1.626 m), weight 111.313 kg (245 lb 6.4 oz), SpO2 99 %, unknown if currently breastfeeding.  No exam performed today, done in office. Dg Chest 2 View  01/26/2016  CLINICAL DATA:  Chest pain EXAM: CHEST  2 VIEW COMPARISON:  None. FINDINGS: Normal heart size. Normal mediastinal contour. No pneumothorax. No pleural effusion. Lungs appear clear, with no acute consolidative airspace disease and no pulmonary edema. IMPRESSION: No active cardiopulmonary disease. Electronically Signed   By: Delbert Phenix M.D.   On: 01/26/2016 11:11   CBC    Component Value Date/Time   WBC 5.5 01/26/2016 1018   RBC 3.60* 01/26/2016 1018   HGB 10.0* 01/26/2016 1018   HCT 31.1* 01/26/2016 1018   PLT 238 01/26/2016 1018   MCV 86.4 01/26/2016 1018   MCH 27.8 01/26/2016 1018   MCHC 32.2 01/26/2016 1018   RDW 15.1 01/26/2016 1018   LYMPHSABS 2.1 06/13/2015 1615   MONOABS 0.6 06/13/2015 1615   EOSABS 0.1 06/13/2015 1615   BASOSABS 0.0 06/13/2015  1615   Uric acid 4.6 Protein/creatinine ratio . Below measurable amt  A/P; Postpartum HTN P) start labetalol 100mg  po bid.  F/u BP with smart start nurse. F/u appt as scheduled in office. May use NSAID and heat for back ED Course   MDM   Donna Mccullough A, MD 12:10 PM 01/26/2016

## 2016-01-26 NOTE — MAU Note (Signed)
Pt post partum 1 week. Pt reports she has been having intermittent sharp chest pain that comes and goes for about 3 days.radiates to her back

## 2016-05-14 ENCOUNTER — Ambulatory Visit (INDEPENDENT_AMBULATORY_CARE_PROVIDER_SITE_OTHER): Payer: 59 | Admitting: Primary Care

## 2016-05-14 VITALS — BP 122/78 | HR 89 | Temp 98.1°F | Ht 66.0 in | Wt 259.8 lb

## 2016-05-14 DIAGNOSIS — Z111 Encounter for screening for respiratory tuberculosis: Secondary | ICD-10-CM | POA: Diagnosis not present

## 2016-05-14 DIAGNOSIS — Z0000 Encounter for general adult medical examination without abnormal findings: Secondary | ICD-10-CM | POA: Insufficient documentation

## 2016-05-14 DIAGNOSIS — O1003 Pre-existing essential hypertension complicating the puerperium: Secondary | ICD-10-CM | POA: Diagnosis not present

## 2016-05-14 DIAGNOSIS — O99211 Obesity complicating pregnancy, first trimester: Secondary | ICD-10-CM | POA: Diagnosis not present

## 2016-05-14 DIAGNOSIS — O24429 Gestational diabetes mellitus in childbirth, unspecified control: Secondary | ICD-10-CM | POA: Diagnosis not present

## 2016-05-14 DIAGNOSIS — R51 Headache: Secondary | ICD-10-CM

## 2016-05-14 DIAGNOSIS — E669 Obesity, unspecified: Secondary | ICD-10-CM | POA: Diagnosis not present

## 2016-05-14 DIAGNOSIS — R519 Headache, unspecified: Secondary | ICD-10-CM

## 2016-05-14 LAB — LIPID PANEL
Cholesterol: 149 mg/dL (ref 0–200)
HDL: 51.1 mg/dL (ref >39.00)
LDL Cholesterol: 91 mg/dL (ref 0–99)
NonHDL: 97.63
TRIGLYCERIDES: 31 mg/dL (ref 0.0–149.0)
Total CHOL/HDL Ratio: 3
VLDL: 6.2 mg/dL (ref 0.0–40.0)

## 2016-05-14 LAB — HEMOGLOBIN A1C: HEMOGLOBIN A1C: 5.6 % (ref 4.6–6.5)

## 2016-05-14 LAB — COMPREHENSIVE METABOLIC PANEL
ALBUMIN: 3.9 g/dL (ref 3.5–5.2)
ALK PHOS: 79 U/L (ref 39–117)
ALT: 14 U/L (ref 0–35)
AST: 17 U/L (ref 0–37)
BUN: 12 mg/dL (ref 6–23)
CALCIUM: 9.5 mg/dL (ref 8.4–10.5)
CO2: 27 mEq/L (ref 19–32)
Chloride: 104 mEq/L (ref 96–112)
Creatinine, Ser: 0.65 mg/dL (ref 0.40–1.20)
GFR: 142.75 mL/min (ref >60.00)
Glucose, Bld: 106 mg/dL — ABNORMAL HIGH (ref 70–99)
POTASSIUM: 4 meq/L (ref 3.5–5.1)
Sodium: 138 mEq/L (ref 135–145)
TOTAL PROTEIN: 7.6 g/dL (ref 6.0–8.3)
Total Bilirubin: 0.4 mg/dL (ref 0.2–1.2)

## 2016-05-14 LAB — TSH: TSH: 0.61 u[IU]/mL (ref 0.35–4.50)

## 2016-05-14 NOTE — Assessment & Plan Note (Signed)
Discussed the importance of a healthy diet and regular exercise in order for weight loss and to reduce risk of other medical diseases.  

## 2016-05-14 NOTE — Assessment & Plan Note (Signed)
Placed on labetalol per GYN, taking 100 mg BID. BP stable in office today. Patient would like to transition from progesterone OCP's to a combination. Discussed would like to monitor BP for 1 additional month as she delivered in April. Will slowly wean off in September, monitor BP, will have her back in the office in late Sept/early October for re-evaluation. If stable, will consider combined OCP's.

## 2016-05-14 NOTE — Progress Notes (Signed)
Pre visit review using our clinic review tool, if applicable. No additional management support is needed unless otherwise documented below in the visit note. 

## 2016-05-14 NOTE — Assessment & Plan Note (Signed)
Tetanus UTD. Discussed the importance of a healthy diet and regular exercise in order for weight loss and to reduce risk of other medical diseases. Labs pending. Exam pending. Will continue to monitor BP. Follow up in 1 year for repeat physical. Form completed, TB test pending.

## 2016-05-14 NOTE — Patient Instructions (Signed)
Complete lab work prior to leaving today. I will notify you of your results once received.   Continue to work on improvements in your diet by limiting fast food, fried foods. Increase vegetables, fruit, lean protein, water.  Start exercising. You should be getting 1 hour of moderate intensity exercise 5 days weekly.  Continue to monitor your blood pressure. Continue labetalol tablets for 1 additional month. Start weaning back on the labetalol September 1st by taking 1 tablet daily for 2 weeks, then stop.   Follow up with me in late September/early October. I will check on you prior to then to ensure your blood pressure is stable.  It was a pleasure to see you today!  DASH Eating Plan DASH stands for "Dietary Approaches to Stop Hypertension." The DASH eating plan is a healthy eating plan that has been shown to reduce high blood pressure (hypertension). Additional health benefits may include reducing the risk of type 2 diabetes mellitus, heart disease, and stroke. The DASH eating plan may also help with weight loss. WHAT DO I NEED TO KNOW ABOUT THE DASH EATING PLAN? For the DASH eating plan, you will follow these general guidelines:  Choose foods with a percent daily value for sodium of less than 5% (as listed on the food label).  Use salt-free seasonings or herbs instead of table salt or sea salt.  Check with your health care provider or pharmacist before using salt substitutes.  Eat lower-sodium products, often labeled as "lower sodium" or "no salt added."  Eat fresh foods.  Eat more vegetables, fruits, and low-fat dairy products.  Choose whole grains. Look for the word "whole" as the first word in the ingredient list.  Choose fish and skinless chicken or [place] more often than red meat. Limit fish, poultry, and meat to 6 oz (170 g) each day.  Limit sweets, desserts, sugars, and sugary drinks.  Choose heart-healthy fats.  Limit cheese to 1 oz (28 g) per day.  Eat more  home-cooked food and less restaurant, buffet, and fast food.  Limit fried foods.  Cook foods using methods other than frying.  Limit canned vegetables. If you do use them, rinse them well to decrease the sodium.  When eating at a restaurant, ask that your food be prepared with less salt, or no salt if possible. WHAT FOODS CAN I EAT? Seek help from a dietitian for individual calorie needs. Grains Whole grain or whole wheat bread. Brown rice. Whole grain or whole wheat pasta. Quinoa, bulgur, and whole grain cereals. Low-sodium cereals. Corn or whole wheat flour tortillas. Whole grain cornbread. Whole grain crackers. Low-sodium crackers. Vegetables Fresh or frozen vegetables (raw, steamed, roasted, or grilled). Low-sodium or reduced-sodium tomato and vegetable juices. Low-sodium or reduced-sodium tomato sauce and paste. Low-sodium or reduced-sodium canned vegetables.  Fruits All fresh, canned (in natural juice), or frozen fruits. Meat and Other Protein Products Ground beef (85% or leaner), grass-fed beef, or beef trimmed of fat. Skinless chicken or [place]. Ground chicken or [place]. Pork trimmed of fat. All fish and seafood. Eggs. Dried beans, peas, or lentils. Unsalted nuts and seeds. Unsalted canned beans. Dairy Low-fat dairy products, such as skim or 1% milk, 2% or reduced-fat cheeses, low-fat ricotta or cottage cheese, or plain low-fat yogurt. Low-sodium or reduced-sodium cheeses. Fats and Oils Tub margarines without trans fats. Light or reduced-fat mayonnaise and salad dressings (reduced sodium). Avocado. Safflower, olive, or canola oils. Natural peanut or almond butter. Other Unsalted popcorn and pretzels. The items listed above may not  be a complete list of recommended foods or beverages. Contact your dietitian for more options. WHAT FOODS ARE NOT RECOMMENDED? Grains White bread. White pasta. White rice. Refined cornbread. Bagels and croissants. Crackers that contain trans  fat. Vegetables Creamed or fried vegetables. Vegetables in a cheese sauce. Regular canned vegetables. Regular canned tomato sauce and paste. Regular tomato and vegetable juices. Fruits Dried fruits. Canned fruit in light or heavy syrup. Fruit juice. Meat and Other Protein Products Fatty cuts of meat. Ribs, chicken wings, bacon, sausage, bologna, salami, chitterlings, fatback, hot dogs, bratwurst, and packaged luncheon meats. Salted nuts and seeds. Canned beans with salt. Dairy Whole or 2% milk, cream, half-and-half, and cream cheese. Whole-fat or sweetened yogurt. Full-fat cheeses or blue cheese. Nondairy creamers and whipped toppings. Processed cheese, cheese spreads, or cheese curds. Condiments Onion and garlic salt, seasoned salt, table salt, and sea salt. Canned and packaged gravies. Worcestershire sauce. Tartar sauce. Barbecue sauce. Teriyaki sauce. Soy sauce, including reduced sodium. Steak sauce. Fish sauce. Oyster sauce. Cocktail sauce. Horseradish. Ketchup and mustard. Meat flavorings and tenderizers. Bouillon cubes. Hot sauce. Tabasco sauce. Marinades. Taco seasonings. Relishes. Fats and Oils Butter, stick margarine, lard, shortening, ghee, and bacon fat. Coconut, palm kernel, or palm oils. Regular salad dressings. Other Pickles and olives. Salted popcorn and pretzels. The items listed above may not be a complete list of foods and beverages to avoid. Contact your dietitian for more information. WHERE CAN I FIND MORE INFORMATION? National Heart, Lung, and Blood Institute: [employer]   This information is not intended to replace advice given to you by your health care provider. Make sure you discuss any questions you have with your health care provider.   Document Released: 09/20/2011 Document Revised: 10/22/2014 Document Reviewed: 08/05/2013 Elsevier Interactive Patient Education [employer]

## 2016-05-14 NOTE — Addendum Note (Signed)
Addended by: Tawnya Crook on: 05/14/2016 09:40 AM   Modules accepted: Orders

## 2016-05-14 NOTE — Assessment & Plan Note (Signed)
Will check A1C today. Discussed the importance of a healthy diet and regular exercise in order for weight loss and to reduce risk of other medical diseases.

## 2016-05-14 NOTE — Progress Notes (Signed)
Subjective:    Patient ID: Donna Mccullough, female    DOB: 1991-03-17, 25 y.o.   MRN: [id number]  HPI  Donna Mccullough is a 25 year old female who presents today for complete physical.  Immunizations: -Tetanus: Completed in February 2017 -Influenza: Did not complete last season  Diet: She is working on improving her diet. Breakfast: Cereal, skips Lunch: Fast food, vegetables, chicken Dinner: Pasta, rice, vegetables, chicken, fast food Snacks: None Desserts: 3 days weekly Beverages: Water, diet sodas, occasional regular soda  Exercise: She does not currently exercise Eye exam: Completed several years ago Dental exam: Completed years ago Pap Smear: Completed in August 2016   Review of Systems  Constitutional: Negative for unexpected weight change.  HENT: Negative for rhinorrhea.   Respiratory: Negative for cough and shortness of breath.   Cardiovascular: Negative for chest pain.  Gastrointestinal: Negative for constipation and diarrhea.  Genitourinary: Negative for difficulty urinating and menstrual problem.  Musculoskeletal: Negative for arthralgias and myalgias.  Skin: Negative for rash.  Allergic/Immunologic: Negative for environmental allergies.  Neurological: Negative for dizziness, numbness and headaches.  Psychiatric/Behavioral:       Denies concerns for anxiety and depression       Past Medical History:  Diagnosis Date  . Elevated blood pressure reading    10/12/11 141/95  09/25/13 140/85  Initial BP at [redacted] weeks GA 139/84  . Gestational diabetes mellitus (GDM), antepartum   . Status post vacuum-assisted vaginal delivery (4/6) 01/19/2016     Social History   Social History  . Marital status: Married    Spouse name: N/A  . Number of children: N/A  . Years of education: N/A   Occupational History  . Not on file.   Social History Main Topics  . Smoking status: Never Smoker  . Smokeless tobacco: Never Used  . Alcohol use No     Comment: socially  . Drug use:  No  . Sexual activity: Yes    Birth control/ protection: None   Other Topics Concern  . Not on file   Social History Narrative   Married.   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   Highest level of education was bachelors.     Past Surgical History:  Procedure Laterality Date  . BREAST REDUCTION SURGERY Bilateral 2014    Family History  Problem Relation Age of Onset  . Hypertension Mother   . Ulcers Mother   . Cancer Maternal Grandfather     prostate  . Cancer Paternal Grandfather     prostate  . Diabetes Paternal Grandfather   . Hypertension Paternal Grandfather   . Hyperlipidemia Paternal Grandfather     No Known Allergies  Current Outpatient Prescriptions on File Prior to Visit  Medication Sig Dispense Refill  . labetalol (NORMODYNE) 100 MG tablet Take 1 tablet (100 mg total) by mouth 2 (two) times daily. 60 tablet 4   No current facility-administered medications on file prior to visit.     BP 122/78 (BP Location: Left Arm, Patient Position: Sitting, Cuff Size: Normal)   Pulse 89   Temp 98.1 F (36.7 C) (Oral)   Ht 5\' 6"  (1.676 m)   Wt 259 lb 12.8 oz (117.8 kg)   LMP 05/04/2016   SpO2 98%   BMI 41.93 kg/m    Objective:   Physical Exam  Constitutional: She is oriented to person, place, and time. She appears well-nourished.  HENT:  Right Ear: Tympanic membrane and ear canal normal.  Left Ear: Tympanic membrane and ear canal normal.  Nose: Nose normal.  Mouth/Throat: Oropharynx is clear and moist.  Eyes: Conjunctivae and EOM are normal. Pupils are equal, round, and reactive to light.  Neck: Neck supple. No thyromegaly present.  Cardiovascular: Normal rate and regular rhythm.   No murmur heard. Pulmonary/Chest: Effort normal and breath sounds normal. She has no rales.  Abdominal: Soft. Bowel sounds are normal. There is no tenderness.  Musculoskeletal: Normal range of motion.  Lymphadenopathy:    She has no cervical  adenopathy.  Neurological: She is alert and oriented to person, place, and time. She has normal reflexes. No cranial nerve deficit.  Skin: Skin is warm and dry. No rash noted.  Psychiatric: She has a normal mood and affect.          Assessment & Plan:

## 2016-05-14 NOTE — Assessment & Plan Note (Signed)
Overall stable, manageable.

## 2016-05-15 ENCOUNTER — Encounter: Payer: Self-pay | Admitting: *Deleted

## 2016-05-16 LAB — TB SKIN TEST
Induration: 0 mm
TB SKIN TEST: NEGATIVE

## 2016-07-16 ENCOUNTER — Ambulatory Visit: Payer: BLUE CROSS/BLUE SHIELD | Admitting: Primary Care

## 2016-07-25 ENCOUNTER — Ambulatory Visit (INDEPENDENT_AMBULATORY_CARE_PROVIDER_SITE_OTHER): Payer: BC Managed Care – PPO | Admitting: Primary Care

## 2016-07-25 ENCOUNTER — Encounter: Payer: Self-pay | Admitting: Primary Care

## 2016-07-25 VITALS — BP 116/76 | HR 91 | Temp 98.2°F | Ht 66.0 in | Wt 260.8 lb

## 2016-07-25 DIAGNOSIS — Z6841 Body Mass Index (BMI) 40.0 and over, adult: Secondary | ICD-10-CM | POA: Diagnosis not present

## 2016-07-25 DIAGNOSIS — Z3041 Encounter for surveillance of contraceptive pills: Secondary | ICD-10-CM | POA: Insufficient documentation

## 2016-07-25 DIAGNOSIS — O1003 Pre-existing essential hypertension complicating the puerperium: Secondary | ICD-10-CM

## 2016-07-25 MED ORDER — NORETHINDRONE ACET-ETHINYL EST 1-20 MG-MCG PO TABS: 1.0000 | ORAL_TABLET | Freq: Every day | ORAL | 3 refills | Status: DC

## 2016-07-25 NOTE — Assessment & Plan Note (Signed)
Stable off of labetalol. Patient will continue to occasionally monitor and report readings at or above 140/90. Will continue to monitor.

## 2016-07-25 NOTE — Assessment & Plan Note (Signed)
BP stable off of labetalol.  Switch to Junel 1/20. Discussed start date.

## 2016-07-25 NOTE — Patient Instructions (Signed)
Start Junel 1/20 birth control tablets on the first Sunday after your menstrual period.  Continue to monitor your blood pressure and notify me if you get readings at or above 140/90 on a consistent basis.  Reduce processed carbohydrates, fried food, junk food, pasta. Focus on increasing portions of vegetables, fruit, whole grains (quinoa, couscous, brown rice), lean protein. Limit carbohydrates including bread, pasta, white rice.   Downlowd the App "My Fitness Pal" to help track calories.  Start exercising. You should be getting 150 minutes of moderate intensity exercise weekly.  It was a pleasure to see you today!

## 2016-07-25 NOTE — Progress Notes (Signed)
Subjective:    Patient ID: Donna Mccullough, female    DOB: 03-06-1991, 25 y.o.   MRN: [id number]  HPI  Donna Mccullough is a 25 year old female who presents today for follow up.   She has a history of antepartum hypertension and was once on labetalol 100 mg BID per GYN that was initiated in April 2017. Last visit she was determined to come off of this medication so she was slowly weaned off of labetalol over September 2017. Her last blood pressure pill was 2 weeks ago. Her BP in the clinic today is stable.  BP Readings from Last 3 Encounters:  07/25/16 116/76  05/14/16 122/78  01/26/16 150/96     2) Obesity: Weight gain of 15 pounds since her first visit in April 2017. She is postpartum in April 2017. She is motivated to lose weight and is asking advice on techniques. She endorses a problem with stress eating and finds herself snacking on unhealthy food.  Her diet currently consists of: Breakfast: Oatmeal, breakfast bowls (bacon, eggs, cheese) Lunch: Cafeteria food (chicken nuggets, brown rice, bbq chicken) pasta, left overs. Dinner: Fast food Snacks: Granola bars, yogurt, popcorn Desserts: Three times weekly Beverages: Water (60 ounces), crystal light, diet pepsi.  Exercise: She does not currently exercise. Does occasionally walk.   Wt Readings from Last 3 Encounters:  07/25/16 260 lb 12.8 oz (118.3 kg)  05/14/16 259 lb 12.8 oz (117.8 kg)  01/26/16 245 lb 6.4 oz (111.3 kg)    3) OCP Management: Currently managed on Estrogen only OCP's given history of postpartum hypertension. She was treated and weaned of of labetalol gradually. Her BP is stable since off of her medication and she is working to lose weight. She would like to be transitioned to a combination OCP to help better regulate her cycles.  Review of Systems  Constitutional: Negative for unexpected weight change.  Eyes: Negative for visual disturbance.  Respiratory: Negative for shortness of breath.   Cardiovascular:  Negative for chest pain.  Neurological: Negative for dizziness and headaches.       Past Medical History:  Diagnosis Date  . Elevated blood pressure reading    10/12/11 141/95  09/25/13 140/85  Initial BP at [redacted] weeks GA 139/84  . Gestational diabetes mellitus (GDM), antepartum   . Status post vacuum-assisted vaginal delivery (4/6) 01/19/2016     Social History   Social History  . Marital status: Married    Spouse name: N/A  . Number of children: N/A  . Years of education: N/A   Occupational History  . Not on file.   Social History Main Topics  . Smoking status: Never Smoker  . Smokeless tobacco: Never Used  . Alcohol use No     Comment: socially  . Drug use: No  . Sexual activity: Yes    Birth control/ protection: None   Other Topics Concern  . Not on file   Social History Narrative   Married.   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   Highest level of education was bachelors.     Past Surgical History:  Procedure Laterality Date  . BREAST REDUCTION SURGERY Bilateral 2014    Family History  Problem Relation Age of Onset  . Hypertension Mother   . Ulcers Mother   . Cancer Maternal Grandfather     prostate  . Cancer Paternal Grandfather     prostate  . Diabetes Paternal Grandfather   . Hypertension Paternal Grandfather   .  Hyperlipidemia Paternal Grandfather     No Known Allergies  Current Outpatient Prescriptions on File Prior to Visit  Medication Sig Dispense Refill  . labetalol (NORMODYNE) 100 MG tablet Take 1 tablet (100 mg total) by mouth 2 (two) times daily. 60 tablet 4   No current facility-administered medications on file prior to visit.     BP 116/76   Pulse 91   Temp 98.2 F (36.8 C) (Oral)   Ht 5\' 6"  (1.676 m)   Wt 260 lb 12.8 oz (118.3 kg)   LMP 07/02/2016   SpO2 98%   BMI 42.09 kg/m    Objective:   Physical Exam  Constitutional: She appears well-nourished.  Neck: Neck supple.  Cardiovascular: Normal  rate and regular rhythm.   Pulmonary/Chest: Effort normal and breath sounds normal.  Skin: Skin is warm and dry.          Assessment & Plan:

## 2016-07-25 NOTE — Assessment & Plan Note (Signed)
Weight gain of 15 pounds since April 2017 due to excess calories through stress eating. Long discussion today regarding her current diet and level of exercise. Tips provided to start calorie counting, reduce junk food/processed carbohydrates. Increase vegetables, fruit, whole grains. Discussed use of the App, My Fitness Pal for calorie counting.

## 2016-07-25 NOTE — Progress Notes (Signed)
Pre visit review using our clinic review tool, if applicable. No additional management support is needed unless otherwise documented below in the visit note. 

## 2016-08-01 ENCOUNTER — Telehealth: Payer: Self-pay | Admitting: Primary Care

## 2016-08-01 NOTE — Telephone Encounter (Signed)
Pt husband dropped off [employer] forms to be filled out (and records release.) I placed order for records release already. Please fax when completed and notify pt for pickup

## 2016-08-01 NOTE — Telephone Encounter (Signed)
Do not see [employer] forms in my inbox. Where are they?

## 2016-08-02 NOTE — Telephone Encounter (Signed)
Sorry. I had the form and now it is placed in Kate's inbox.

## 2016-08-03 NOTE — Telephone Encounter (Signed)
Noted and completed. Placed in Chan's inbox.

## 2016-08-03 NOTE — Telephone Encounter (Signed)
Faxed paperwork as requested. Left patient message to call back.

## 2017-05-13 ENCOUNTER — Emergency Department
Admission: EM | Admit: 2017-05-13 | Discharge: 2017-05-13 | Disposition: A | Discharge status: Home / Self Care | Payer: BC Managed Care – PPO | Attending: Emergency Medicine | Admitting: Emergency Medicine

## 2017-05-13 ENCOUNTER — Telehealth: Payer: Self-pay | Admitting: Primary Care

## 2017-05-13 ENCOUNTER — Encounter: Payer: Self-pay | Admitting: Emergency Medicine

## 2017-05-13 ENCOUNTER — Emergency Department: Payer: BC Managed Care – PPO

## 2017-05-13 DIAGNOSIS — R0789 Other chest pain: Secondary | ICD-10-CM | POA: Diagnosis not present

## 2017-05-13 DIAGNOSIS — R079 Chest pain, unspecified: Secondary | ICD-10-CM

## 2017-05-13 LAB — COMPREHENSIVE METABOLIC PANEL
ALT: 12 U/L — AB (ref 14–54)
AST: 19 U/L (ref 15–41)
Albumin: 3.7 g/dL (ref 3.5–5.0)
Alkaline Phosphatase: 67 U/L (ref 38–126)
Anion gap: 6 (ref 5–15)
BILIRUBIN TOTAL: 0.3 mg/dL (ref 0.3–1.2)
BUN: 10 mg/dL (ref 6–20)
CHLORIDE: 107 mmol/L (ref 101–111)
CO2: 25 mmol/L (ref 22–32)
CREATININE: 0.63 mg/dL (ref 0.44–1.00)
Calcium: 8.9 mg/dL (ref 8.9–10.3)
GFR calc Af Amer: >60 mL/min (ref >60)
Glucose, Bld: 87 mg/dL (ref 65–99)
Potassium: 3.3 mmol/L — ABNORMAL LOW (ref 3.5–5.1)
Sodium: 138 mmol/L (ref 135–145)
TOTAL PROTEIN: 8 g/dL (ref 6.5–8.1)

## 2017-05-13 LAB — CBC
HCT: 34.7 % — ABNORMAL LOW (ref 35.0–47.0)
Hemoglobin: 11.6 g/dL — ABNORMAL LOW (ref 12.0–16.0)
MCH: 28 pg (ref 26.0–34.0)
MCHC: 33.4 g/dL (ref 32.0–36.0)
MCV: 83.9 fL (ref 80.0–100.0)
PLATELETS: 264 10*3/uL (ref 150–440)
RBC: 4.14 MIL/uL (ref 3.80–5.20)
RDW: 14 % (ref 11.5–14.5)
WBC: 5.6 10*3/uL (ref 3.6–11.0)

## 2017-05-13 LAB — TROPONIN I: Troponin I: <0.03 ng/mL (ref <0.03)

## 2017-05-13 LAB — LIPASE, BLOOD: LIPASE: 17 U/L (ref 11–51)

## 2017-05-13 MED ORDER — FAMOTIDINE 20 MG PO TABS: 20.0000 mg | ORAL_TABLET | Freq: Two times a day (BID) | ORAL | 1 refills | Status: DC

## 2017-05-13 MED ORDER — GI COCKTAIL ~~LOC~~
30.0000 mL | Freq: Once | ORAL | Status: AC
  Administered 2017-05-13: 30 mL via ORAL
  Filled 2017-05-13: qty 30

## 2017-05-13 NOTE — ED Provider Notes (Signed)
[facility] Emergency Department Provider Note       Time seen: ----------------------------------------- 4:50 PM on 05/13/2017 -----------------------------------------     I have reviewed the triage vital signs and the nursing notes.   HISTORY   Chief Complaint Chest Pain    HPI Donna Mccullough is a 26 y.o. female who presents to the ED for substernal chest pain for the last month. Patient states the pain is usually after eating she describes as a pressure. Today she was not eating when the event occurred. Nothing seems to make it better at this point. She denies fevers, chills, vomiting or diarrhea.   Past Medical History:  Diagnosis Date  . Elevated blood pressure reading    10/12/11 141/95  09/25/13 140/85  Initial BP at [redacted] weeks GA 139/84  . Gestational diabetes mellitus (GDM), antepartum   . Status post vacuum-assisted vaginal delivery (4/6) 01/19/2016    Patient Active Problem List   Diagnosis Date Noted  . Encounter for surveillance of contraceptive pills 07/25/2016  . Essential hypertension-postpartum 05/14/2016  . Preventative health care 05/14/2016  . Gestational diabetes mellitus (GDM), delivered 01/19/2016  . Morbid obesity with BMI of 40.0-44.9, adult (HCC) 06/13/2015  . Frequent headaches 05/17/2015    Past Surgical History:  Procedure Laterality Date  . BREAST REDUCTION SURGERY Bilateral 2014    Allergies Patient has no known allergies.  Social History Social History  Substance Use Topics  . Smoking status: Never Smoker  . Smokeless tobacco: Never Used  . Alcohol use No     Comment: socially    Review of Systems Constitutional: Negative for fever. Eyes: Negative for vision changes ENT:  Negative for congestion, sore throat Cardiovascular: Positive for chest pain Respiratory: Negative for shortness of breath. Gastrointestinal: Negative for abdominal pain, vomiting and diarrhea. Genitourinary: Negative for  dysuria. Musculoskeletal: Negative for back pain. Skin: Negative for rash. Neurological: Negative for headaches, focal weakness or numbness.  All systems negative/normal/unremarkable except as stated in the HPI  ____________________________________________   PHYSICAL EXAM:  VITAL SIGNS: ED Triage Vitals  Enc Vitals Group     BP 05/13/17 1420 (!) 158/89     Pulse Rate 05/13/17 1420 (!) 102     Resp 05/13/17 1420 20     Temp 05/13/17 1420 98.2 F (36.8 C)     Temp Source 05/13/17 1420 Oral     SpO2 05/13/17 1420 99 %     Weight 05/13/17 1422 250 lb (113.4 kg)     Height 05/13/17 1422 5\' 5"  (1.651 m)     Head Circumference --      Peak Flow --      Pain Score 05/13/17 1420 1     Pain Loc --      Pain Edu? --      Excl. in GC? --     Constitutional: Alert and oriented. Well appearing and in no distress. Eyes: Conjunctivae are normal. Normal extraocular movements. ENT   Head: Normocephalic and atraumatic.   Nose: No congestion/rhinnorhea.   Mouth/Throat: Mucous membranes are moist.   Neck: No stridor. Cardiovascular: Normal rate, regular rhythm. No murmurs, rubs, or gallops. Respiratory: Normal respiratory effort without tachypnea nor retractions. Breath sounds are clear and equal bilaterally. No wheezes/rales/rhonchi. Gastrointestinal: Soft and nontender. Normal bowel sounds Musculoskeletal: Nontender with normal range of motion in extremities. No lower extremity tenderness nor edema. Neurologic:  Normal speech and language. No gross focal neurologic deficits are appreciated.  Skin:  Skin is warm, dry and  intact. No rash noted. Psychiatric: Mood and affect are normal. Speech and behavior are normal.  ____________________________________________  EKG: Interpreted by me.Sinus rhythm rate 86 bpm, normal PR interval, normal QT, nonspecific T-wave changes, possible LVH  ____________________________________________  ED COURSE:  Pertinent labs & imaging results  that were available during my care of the patient were reviewed by me and considered in my medical decision making (see chart for details). Patient presents for chest pain, we will assess with labs and imaging as indicated.   Procedures ____________________________________________   LABS (pertinent positives/negatives)  Labs Reviewed  CBC - Abnormal; Notable for the following:       Result Value   Hemoglobin 11.6 (*)    HCT 34.7 (*)    All other components within normal limits  COMPREHENSIVE METABOLIC PANEL - Abnormal; Notable for the following:    Potassium 3.3 (*)    ALT 12 (*)    All other components within normal limits  TROPONIN I  LIPASE, BLOOD    RADIOLOGY Images were viewed by me  Chest x-ray  IMPRESSION: No active cardiopulmonary disease. ____________________________________________  FINAL ASSESSMENT AND PLAN  Chest pain  Plan: Patient's labs and imaging were dictated above. Patient had presented for Chest pain of uncertain etiology. I will prescribe antacids for her and advise close outpatient follow-up.   Emily FilbertWilliams, Jonathan E, MD   Note: This note was generated in part or whole with voice recognition software. Voice recognition is usually quite accurate but there are transcription errors that can and very often do occur. I apologize for any typographical errors that were not detected and corrected.     Emily FilbertWilliams, Jonathan E, MD 05/13/17 [phone]

## 2017-05-13 NOTE — Telephone Encounter (Signed)
Patient Name: Donna Mccullough DOB: 12/27/1990 Initial Comment Caller states, she is having chest discomfort. Nurse Assessment Nurse: Yetta BarreJones, RN, Miranda Date/Time (Eastern Time): 05/13/2017 1:15:45 PM Confirm and document reason for call. If symptomatic, describe symptoms. ---Caller states she has chest tightness and discomfort off and on for several weeks. It gets worse after eating. Does the patient have any new or worsening symptoms? ---Yes Will a triage be completed? ---Yes Related visit to physician within the last 2 weeks? ---No Does the PT have any chronic conditions? (i.e. diabetes, asthma, etc.) ---No Is the patient pregnant or possibly pregnant? (Ask all females between the ages of 7812-55) ---No Is this a behavioral health or substance abuse call? ---No Guidelines Guideline Title Affirmed Question Affirmed Notes Chest Pain Recent long-distance travel with prolonged time in car, bus, plane, or train (i.e., within past 2 weeks; 6 or more hours duration) Final Disposition User Go to ED Now Yetta BarreJones, RN, Miranda Comments [place] She states she will go to the ED once she gets back home. She states she is about 1 hr away. Reinforced ED now recommendations. Referrals [facility] - ED Disagree/Comply: Comply

## 2017-05-13 NOTE — ED Triage Notes (Signed)
States sternal chest pain intermttent x 1 month. States usually after eating.

## 2017-05-13 NOTE — Telephone Encounter (Signed)
Please check on patient tomorrow to see if she was evaluated in the emergency department. If not then please schedule her for an office visit.

## 2017-05-14 NOTE — Telephone Encounter (Signed)
Message left for patient to return my call.  

## 2017-05-15 ENCOUNTER — Emergency Department
Admission: EM | Admit: 2017-05-15 | Discharge: 2017-05-15 | Disposition: A | Discharge status: Home / Self Care | Payer: BC Managed Care – PPO | Attending: Emergency Medicine | Admitting: Emergency Medicine

## 2017-05-15 ENCOUNTER — Ambulatory Visit (INDEPENDENT_AMBULATORY_CARE_PROVIDER_SITE_OTHER): Payer: BC Managed Care – PPO | Admitting: Family Medicine

## 2017-05-15 ENCOUNTER — Other Ambulatory Visit: Payer: Self-pay

## 2017-05-15 ENCOUNTER — Emergency Department: Payer: BC Managed Care – PPO

## 2017-05-15 ENCOUNTER — Encounter: Payer: Self-pay | Admitting: Emergency Medicine

## 2017-05-15 VITALS — BP 130/84 | HR 87 | Temp 98.5°F | Ht 66.0 in | Wt 253.5 lb

## 2017-05-15 DIAGNOSIS — Z79899 Other long term (current) drug therapy: Secondary | ICD-10-CM | POA: Insufficient documentation

## 2017-05-15 DIAGNOSIS — R079 Chest pain, unspecified: Secondary | ICD-10-CM | POA: Insufficient documentation

## 2017-05-15 DIAGNOSIS — R1013 Epigastric pain: Secondary | ICD-10-CM | POA: Diagnosis not present

## 2017-05-15 DIAGNOSIS — O24429 Gestational diabetes mellitus in childbirth, unspecified control: Secondary | ICD-10-CM | POA: Diagnosis not present

## 2017-05-15 DIAGNOSIS — R7989 Other specified abnormal findings of blood chemistry: Secondary | ICD-10-CM | POA: Diagnosis present

## 2017-05-15 LAB — POCT PREGNANCY, URINE: Preg Test, Ur: NEGATIVE

## 2017-05-15 LAB — D-DIMER, QUANTITATIVE: D-Dimer, Quant: 0.53 mcg/mL FEU — ABNORMAL HIGH (ref <0.50)

## 2017-05-15 MED ORDER — IOPAMIDOL (ISOVUE-370) INJECTION 76%
75.0000 mL | Freq: Once | INTRAVENOUS | Status: AC | PRN
  Administered 2017-05-15: 75 mL via INTRAVENOUS

## 2017-05-15 MED ORDER — OMEPRAZOLE 20 MG PO CPDR: 20.0000 mg | DELAYED_RELEASE_CAPSULE | Freq: Two times a day (BID) | ORAL | 3 refills | Status: DC

## 2017-05-15 NOTE — Telephone Encounter (Signed)
Patient was seen by Dr Patsy Lageropland on 05/15/2017

## 2017-05-15 NOTE — Discharge Instructions (Signed)
Please seek medical attention for any high fevers, chest pain, shortness of breath, change in behavior, persistent vomiting, bloody stool or any other new or concerning symptoms.  

## 2017-05-15 NOTE — Patient Instructions (Addendum)
Start with the Omeprazole 20 mg twice a day.   We will be in touch about your results and what to do next.

## 2017-05-15 NOTE — ED Triage Notes (Addendum)
Patient ambulatory to triage with steady gait, without difficulty or distress noted; pt reports [facility] and called to come in; was seen here on Monday for mid CP, nonradiating x 3wks with no accomp symptoms; no symptoms at present; pt taken to room 6 and placed in hosp gown & on card monitor for EKG and further evaluation

## 2017-05-15 NOTE — ED Provider Notes (Signed)
[facility]   ____________________________________________   I have reviewed the triage vital signs and the nursing notes.   HISTORY  Chief Complaint Abnormal Lab   History limited by: Not Limited   HPI Donna Mccullough is a 26 y.o. female who presents to the emergency department today because of elevated d-dimer. The patient was seen recently by her primary care physician. She was there for an ER follow-up after chest pain. Primary care physician did check a d-dimer. Patient states she actually has felt better since coming to the emergency department until today when she started redeveloping some chest pain. It is located in the mid chest. She denies any shortness breath. Denies any cough. Denies any leg swelling. Patient does take birth control pills. She denies any smoking.   Past Medical History:  Diagnosis Date  . Elevated blood pressure reading    10/12/11 141/95  09/25/13 140/85  Initial BP at [redacted] weeks GA 139/84  . Gestational diabetes mellitus (GDM), antepartum   . Status post vacuum-assisted vaginal delivery (4/6) 01/19/2016    Patient Active Problem List   Diagnosis Date Noted  . Encounter for surveillance of contraceptive pills 07/25/2016  . Essential hypertension-postpartum 05/14/2016  . Preventative health care 05/14/2016  . Gestational diabetes mellitus (GDM), delivered 01/19/2016  . Morbid obesity with BMI of 40.0-44.9, adult (HCC) 06/13/2015  . Frequent headaches 05/17/2015    Past Surgical History:  Procedure Laterality Date  . BREAST REDUCTION SURGERY Bilateral 2014    Prior to Admission medications   Medication Sig Start Date End Date Taking? Authorizing Provider  norethindrone-ethinyl estradiol (JUNEL 1/20) 1-20 MG-MCG tablet Take 1 tablet by mouth daily. 07/25/16   Doreene Nestlark, Katherine K, NP  omeprazole (PRILOSEC) 20 MG capsule Take 1 capsule (20 mg total) by mouth 2 (two) times daily before a meal.  05/15/17   Copland, Karleen HampshireSpencer, MD    Allergies Patient has no known allergies.  Family History  Problem Relation Age of Onset  . Hypertension Mother   . Ulcers Mother   . Cancer Maternal Grandfather        prostate  . Cancer Paternal Grandfather        prostate  . Diabetes Paternal Grandfather   . Hypertension Paternal Grandfather   . Hyperlipidemia Paternal Grandfather     Social History Social History  Substance Use Topics  . Smoking status: Never Smoker  . Smokeless tobacco: Never Used  . Alcohol use No     Comment: socially    Review of Systems Constitutional: No fever/chills Eyes: No visual changes. ENT: No sore throat. Cardiovascular: Positive for chest pain Respiratory: Denies shortness of breath. Gastrointestinal: No abdominal pain.  No nausea, no vomiting.  No diarrhea.   Genitourinary: Negative for dysuria. Musculoskeletal: Negative for back pain. Skin: Negative for rash. Neurological: Negative for headaches, focal weakness or numbness.  ____________________________________________   PHYSICAL EXAM:  VITAL SIGNS: ED Triage Vitals  Enc Vitals Group     BP 05/15/17 2142 (!) 156/97     Pulse Rate 05/15/17 2142 76     Resp 05/15/17 2142 18     Temp 05/15/17 2142 98 F (36.7 C)     Temp Source 05/15/17 2142 Oral     SpO2 05/15/17 2142 100 %     Weight 05/15/17 2140 253 lb (114.8 kg)     Height 05/15/17 2140 5\' 6"  (1.676 m)     Constitutional: Alert and oriented. Well appearing and in no distress.  Eyes: Conjunctivae are normal.  ENT   Head: Normocephalic and atraumatic.   Nose: No congestion/rhinnorhea.   Mouth/Throat: Mucous membranes are moist.   Neck: No stridor. Hematological/Lymphatic/Immunilogical: No cervical lymphadenopathy. Cardiovascular: Normal rate, regular rhythm.  No murmurs, rubs, or gallops.  Respiratory: Normal respiratory effort without tachypnea nor retractions. Breath sounds are clear and equal bilaterally. No  wheezes/rales/rhonchi. Gastrointestinal: Soft and non tender. No rebound. No guarding.  Genitourinary: Deferred Musculoskeletal: Normal range of motion in all extremities. No lower extremity edema. Neurologic:  Normal speech and language. No gross focal neurologic deficits are appreciated.  Skin:  Skin is warm, dry and intact. No rash noted. Psychiatric: Mood and affect are normal. Speech and behavior are normal. Patient exhibits appropriate insight and judgment.  ____________________________________________    LABS (pertinent positives/negatives)  Labs Reviewed  POCT PREGNANCY, URINE     ____________________________________________   EKG  I, Phineas SemenGraydon Esbeidy Mclaine, attending physician, personally viewed and interpreted this EKG  EKG Time: 2151 Rate: 72 Rhythm: normal sinus rhythm Axis: normal Intervals: qtc 425 QRS: narrow ST changes: no st elevation, t wave inversion V3, V4 Impression: abnormal ekg  ____________________________________________    RADIOLOGY  CT angio IMPRESSION: No pulmonary emboli or acute abnormality.  ____________________________________________   PROCEDURES  Procedures  ____________________________________________   INITIAL IMPRESSION / ASSESSMENT AND PLAN / ED COURSE  Pertinent labs & imaging results that were available during my care of the patient were reviewed by me and considered in my medical decision making (see chart for details).  Patient presented to the emergency department today because of concerns for elevated d-dimer and chest pain. CTA negative here. We will discharge follow-up with primary care.  ____________________________________________   FINAL CLINICAL IMPRESSION(S) / ED DIAGNOSES  Final diagnoses:  Chest pain, unspecified type     Note: This dictation was prepared with Dragon dictation. Any transcriptional errors that result from this process are unintentional     Phineas SemenGoodman, Allyana Vogan, MD 05/15/17 2338

## 2017-05-15 NOTE — Progress Notes (Signed)
Dr. Frederico Hamman T. Marijose Curington, MD, [facility] [place], [address] Phone: [phone] Fax: [phone]  05/15/2017  Patient: Laqueisha Catalina, MRN: [id number], DOB: 1991-03-27, 26 y.o.  Primary Physician:  Pleas Koch, NP   Chief Complaint  Patient presents with  . Follow-up    ED Visit for Chest Pain   Subjective:   Jolyn Deshmukh is a 26 y.o. very pleasant female patient who presents with the following:  I was asked to urgently evaluate this patient for a chest pain ER follow-up visit, and she had a goal of leaving to go to [place] at midnight of the day of evaluation.  ER notes reviewed.  Troponins are negative. CMP is grossly normal with the exception of a potassium of 3.3. Lipase is normal. CBC is normal.  Initial EKG in the emergency room showed inverted T waves on 3, V2, and V3.  There appeared to be no ST depression or elevation.  No prior baseline is available.  Started about three and a half weeks ago. Felt a tightness and after eating and increasing got more with eating. Over the weekend - sometimes with eating. Sometimes with a stuffy nose.  Monday had a pressure and tightness this continued to escalate and worsen and lasted many hours without abating.   GI cocktail did not help.  Has a HA now.   Tingling in legs today only?  BP Readings from Last 3 Encounters:  05/15/17 130/84  05/13/17 (!) 160/105  07/25/16 116/76     Past Medical History, Surgical History, Social History, Family History, Problem List, Medications, and Allergies have been reviewed and updated if relevant.  Patient Active Problem List   Diagnosis Date Noted  . Encounter for surveillance of contraceptive pills 07/25/2016  . Essential hypertension-postpartum 05/14/2016  . Preventative health care 05/14/2016  . Gestational diabetes mellitus (GDM), delivered 01/19/2016  . Morbid obesity with BMI of 40.0-44.9, adult ([place]) 06/13/2015    . Frequent headaches 05/17/2015    Past Medical History:  Diagnosis Date  . Elevated blood pressure reading    10/12/11 141/95  09/25/13 140/85  Initial BP at [redacted] weeks GA 139/84  . Gestational diabetes mellitus (GDM), antepartum   . Status post vacuum-assisted vaginal delivery (4/6) 01/19/2016    Past Surgical History:  Procedure Laterality Date  . BREAST REDUCTION SURGERY Bilateral 2014    Social History   Social History  . Marital status: Married    Spouse name: N/A  . Number of children: N/A  . Years of education: N/A   Occupational History  . Not on file.   Social History Main Topics  . Smoking status: Never Smoker  . Smokeless tobacco: Never Used  . Alcohol use No     Comment: socially  . Drug use: No  . Sexual activity: Yes    Birth control/ protection: None   Other Topics Concern  . Not on file   Social History Narrative   Married.   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   Highest level of education was bachelors.     Family History  Problem Relation Age of Onset  . Hypertension Mother   . Ulcers Mother   . Cancer Maternal Grandfather        prostate  . Cancer Paternal Grandfather        prostate  . Diabetes Paternal Grandfather   . Hypertension Paternal Grandfather   . Hyperlipidemia  Paternal Grandfather     No Known Allergies  Medication list reviewed and updated in full in [place].   GEN: No acute illnesses, no fevers, chills. GI: as above Pulm: No SOB Interactive and getting along well at home.  Otherwise, ROS is as per the HPI.  Objective:   BP 130/84   Pulse 87   Temp 98.5 F (36.9 C) (Oral)   Ht 5' 6"  (1.676 m)   Wt 253 lb 8 oz (115 kg)   LMP 05/13/2017   BMI 40.92 kg/m   GEN: WDWN, NAD, Non-toxic, A & O x 3 HEENT: Atraumatic, Normocephalic. Neck supple. No masses, No LAD. Ears and Nose: No external deformity. CV: RRR, No M/G/R. No JVD. No thrill. No extra heart sounds. PULM: CTA B,  no wheezes, crackles, rhonchi. No retractions. No resp. distress. No accessory muscle use. Chest wall is entirely nontender. EXTR: No c/c/e NEURO Normal gait.  PSYCH: Normally interactive. Conversant. Not depressed or anxious appearing.  Calm demeanor.   Laboratory and Imaging Data: Recent Results (from the past 2160 hour(s))  CBC     Status: Abnormal   Collection Time: 05/13/17  2:35 PM  Result Value Ref Range   WBC 5.6 3.6 - 11.0 K/uL   RBC 4.14 3.80 - 5.20 MIL/uL   Hemoglobin 11.6 (L) 12.0 - 16.0 g/dL   HCT 34.7 (L) 35.0 - 47.0 %   MCV 83.9 80.0 - 100.0 fL   MCH 28.0 26.0 - 34.0 pg   MCHC 33.4 32.0 - 36.0 g/dL   RDW 14.0 11.5 - 14.5 %   Platelets 264 150 - 440 K/uL  Troponin I     Status: None   Collection Time: 05/13/17  2:35 PM  Result Value Ref Range   Troponin I <0.03 <0.03 ng/mL  Comprehensive metabolic panel     Status: Abnormal   Collection Time: 05/13/17  2:35 PM  Result Value Ref Range   Sodium 138 135 - 145 mmol/L   Potassium 3.3 (L) 3.5 - 5.1 mmol/L   Chloride 107 101 - 111 mmol/L   CO2 25 22 - 32 mmol/L   Glucose, Bld 87 65 - 99 mg/dL   BUN 10 6 - 20 mg/dL   Creatinine, Ser 0.63 0.44 - 1.00 mg/dL   Calcium 8.9 8.9 - 10.3 mg/dL   Total Protein 8.0 6.5 - 8.1 g/dL   Albumin 3.7 3.5 - 5.0 g/dL   AST 19 15 - 41 U/L   ALT 12 (L) 14 - 54 U/L   Alkaline Phosphatase 67 38 - 126 U/L   Total Bilirubin 0.3 0.3 - 1.2 mg/dL   GFR calc non Af Amer >60 >60 mL/min   GFR calc Af Amer >60 >60 mL/min    Comment: (NOTE) The eGFR has been calculated using the CKD EPI equation. This calculation has not been validated in all clinical situations. eGFR's persistently <60 mL/min signify possible Chronic Kidney Disease.    Anion gap 6 5 - 15  Lipase, blood     Status: None   Collection Time: 05/13/17  2:35 PM  Result Value Ref Range   Lipase 17 11 - 51 U/L  D-dimer, quantitative (not at [facility])     Status: Abnormal   Collection Time: 05/15/17  3:51 PM  Result Value Ref Range    D-Dimer, Quant 0.53 (H) <0.50 mcg/mL FEU    Comment:   The D-Dimer test is used frequently to exclude an acute PE or DVT.  In  patients with a low to moderate clinical risk assessment and a D-Dimer result <0.50 mcg/mL FEU, the likelihood of a PE or DVT is very low.  However, a thromboembolic event should not be excluded solely on the basis of the D-Dimer level.  Increased levels of D-Dimer are associated with a PE, DVT, DIC, malignancies, inflammation, sepsis, surgery, trauma, pregnancy, and advancing patient age. [Jama 2006 11:295(2): 240-973]   For additional information, please refer to: http://education.questdiagnostics.com/faq/FAQ149 (This link is being provided for information/ educational purposes only)     Hemoglobin A1c     Status: None   Collection Time: 05/15/17  3:51 PM  Result Value Ref Range   Hgb A1c MFr Bld 5.6 4.6 - 6.5 %    Comment: Glycemic Control Guidelines for People with Diabetes:Non Diabetic:  <6%Goal of Therapy: <7%Additional Action Suggested:  >8%   Pregnancy, urine POC     Status: None   Collection Time: 05/15/17 11:03 PM  Result Value Ref Range   Preg Test, Ur NEGATIVE NEGATIVE    Comment:        THE SENSITIVITY OF THIS METHODOLOGY IS >24 mIU/mL     Dg Chest 2 View  Result Date: 05/13/2017 CLINICAL DATA:  Intermittent substernal chest pain for 1 month. EXAM: CHEST  2 VIEW COMPARISON:  Chest x-ray dated January 26, 2016. FINDINGS: The cardiomediastinal silhouette is normal in size. Normal pulmonary vascularity. No focal consolidation, pleural effusion, or pneumothorax. No acute osseous abnormality. IMPRESSION: No active cardiopulmonary disease. Electronically Signed   By: Titus Dubin M.D.   On: 05/13/2017 17:14   Ct Angio Chest Pe W And/or Wo Contrast  Result Date: 05/15/2017 CLINICAL DATA:  Mid chest pain for the past 3 weeks. Elevated D-dimer. Previous breast reduction surgery. EXAM: CT ANGIOGRAPHY CHEST WITH CONTRAST TECHNIQUE: Multidetector CT  imaging of the chest was performed using the standard protocol during bolus administration of intravenous contrast. Multiplanar CT image reconstructions and MIPs were obtained to evaluate the vascular anatomy. CONTRAST:  75 cc Isovue 370 COMPARISON:  Chest radiographs obtained yesterday. FINDINGS: Cardiovascular: Normally opacified pulmonary arteries with no pulmonary arterial filling defects seen. Normal sized heart. Mediastinum/Nodes: No enlarged mediastinal, hilar, or axillary lymph nodes. Thyroid gland, trachea, and esophagus demonstrate no significant findings. Lungs/Pleura: Clear lungs.  No pleural fluid. Upper Abdomen: Unremarkable. Musculoskeletal: Minimal thoracic spine degenerative changes. Postsurgical fat necrosis in the central right breast. Review of the MIP images confirms the above findings. IMPRESSION: No pulmonary emboli or acute abnormality. Electronically Signed   By: Claudie Revering M.D.   On: 05/15/2017 23:26     Assessment and Plan:   Acute chest pain - Plan: D-dimer, quantitative (not at [facility]), EKG 12-Lead  Gestational diabetes mellitus (GDM), delivered - Plan: Hemoglobin A1c  Abdominal pain, epigastric  Emergent ER follow-up in a patient with chest pain, 26 years old, with intermittent chest pain for 3 weeks on birth control pills.  At this point, the patient's d-dimer came back positive, and I recommended urgent follow-up for further evaluation.  She went to the emergency room that evening and had a negative CT angiogram of the chest.  Follow-up EKG in our office shows normal sinus rhythm.  There is an inverted T wave in lead 3.  There are some other nonspecific T-wave changes, but no ST elevation or depression.  Heist in the differential in this case would be all circumflex, gastritis, versus symptomatic cholelithiasis.  Follow-up: pcp  Meds ordered this encounter  Medications  . omeprazole (PRILOSEC) 20  MG capsule    Sig: Take 1 capsule (20 mg total) by mouth 2 (two)  times daily before a meal.    Dispense:  60 capsule    Refill:  3   Medications Discontinued During This Encounter  Medication Reason  . labetalol (NORMODYNE) 100 MG tablet Discontinued by provider  . famotidine (PEPCID) 20 MG tablet    Orders Placed This Encounter  Procedures  . D-dimer, quantitative (not at [facility])  . Hemoglobin A1c  . EKG 12-Lead    Signed,  Riann Oman T. Delsy Etzkorn, MD   Allergies as of 05/15/2017   No Known Allergies     Medication List       Accurate as of 05/15/17 11:59 PM. Always use your most recent med list.          norethindrone-ethinyl estradiol 1-20 MG-MCG tablet Commonly known as:  JUNEL 1/20 Take 1 tablet by mouth daily.   omeprazole 20 MG capsule Commonly known as:  PRILOSEC Take 1 capsule (20 mg total) by mouth 2 (two) times daily before a meal.

## 2017-05-16 ENCOUNTER — Telehealth: Payer: Self-pay

## 2017-05-16 LAB — HEMOGLOBIN A1C: HEMOGLOBIN A1C: 5.6 % (ref 4.6–6.5)

## 2017-05-16 NOTE — Telephone Encounter (Signed)
PLEASE NOTE: All timestamps contained within this report are represented as [place] Standard Time. CONFIDENTIALTY NOTICE: This fax transmission is intended only for the addressee. It contains information that is legally privileged, confidential or otherwise protected from use or disclosure. If you are not the intended recipient, you are strictly prohibited from reviewing, disclosing, copying using or disseminating any of this information or taking any action in reliance on or regarding this information. If you have received this fax in error, please notify us immediately by telephone so that we can arrange for its return to us. Phone: [phone], Toll-Free: [phone], Fax: [phone] Page: 1 of 2 Call Id: [id number] [facility] Client TELEPHONE ADVICE RECORD [facility] Patient Name: Donna Mccullough Gender: Female DOB: 10/23/1990 Age: [age] Y 28 D Return Phone Number: [place]/State/Zip: [place] Statistic[place] [facility] Physician Hannah Beatopland, Spencer - MD Who Is Calling Lab [facility] Phone Number [phone] opt 2 Lab Tech Name HepzibahBrandy Lab Reference Number [id number] Chief Complaint Lab Result (Critical or Stat) Call Type Lab Send to RN Reason for Call Report lab results Initial Comment Caller is Brandy from [facility] with stat lab. Dr is MicrosoftCopeland. Pt is Donna Mccullough [birth year], CB [phone] Op t 2, REF# [id number]. Additional Comment Nurse Assessment Nurse: Hart RochesterLawson, RN, Darl PikesSusan Date/Time Lamount Cohen(Eastern Time): 05/15/2017 7:52:58 PM Is there an on-call provider listed? ---Yes Please list name of person reporting value (Lab Employee) and a contact number. ---D-Dimer [id number] 351 pm 0.53 (less than 0.50) Spoke with Essie Christineeri Soltas [phone] [phone] Please document the following items: Lab name Lab value (read back to lab to verify) Reference range for lab value  Date and time blood was drawn Collect time of birth for bilirubin results ---D-Dimer [id number] 351 pm 0.53 (less than 0.50) soltas Please collect the patient contact information from the lab. (name, phone number and address) ---Donna Mccullough [phone] no address List any special notes provided by lab. ---none Guidelines Guideline Title Affirmed Question Disp. Time Lamount Cohen(Eastern Time) Disposition Final User 05/15/2017 8:10:56 PM Clinical Call Yes Hart RochesterLawson, RN, Clemetine MarkerSusan Paging DoctorName Phone DateTime Action Result/Outcome Notes Sanda LingerJones, Thomas - MD [phone] 05/15/2017 7:59:59 PM Doctor Paged Paged On Call Back to Call Center Please call [phone] for critical lab result Thank you! Althea CharonSusan Lawson RN [phone] PLEASE NOTE: All timestamps contained within this report are represented as [place] Standard Time. CONFIDENTIALTY NOTICE: This fax transmission is intended only for the addressee. It contains information that is legally privileged, confidential or otherwise protected from use or disclosure. If you are not the intended recipient, you are strictly prohibited from reviewing, disclosing, copying using or disseminating any of this information or taking any action in reliance on or regarding this information. If you have received this fax in error, please notify us immediately by telephone so that we can arrange for its return to us. Phone: [phone], Toll-Free: [phone], Fax: [phone] Page: 2 of 2 Call Id: [id number] Paging DoctorName Phone DateTime Action Result/Outcome Notes Sanda LingerJones, Thomas - MD 05/15/2017 8:10:46 PM Message Result Spoke with On Call - General Reports "no action needed."

## 2017-05-16 NOTE — Telephone Encounter (Signed)
Per chart review tab pt [facility].

## 2017-05-19 ENCOUNTER — Encounter: Payer: Self-pay | Admitting: Family Medicine

## 2017-06-21 ENCOUNTER — Telehealth: Payer: Self-pay | Admitting: Primary Care

## 2017-06-21 DIAGNOSIS — Z3041 Encounter for surveillance of contraceptive pills: Secondary | ICD-10-CM

## 2017-06-21 NOTE — Telephone Encounter (Signed)
CPE reminder letter mailed to pt  

## 2017-06-25 MED ORDER — NORETHINDRONE ACET-ETHINYL EST 1-20 MG-MCG PO TABS: 1.0000 | ORAL_TABLET | Freq: Every day | ORAL | 0 refills | Status: DC

## 2017-06-25 NOTE — Telephone Encounter (Signed)
Will send 1 month supply of Junel for patient.

## 2017-06-25 NOTE — Telephone Encounter (Signed)
Patient scheduled cpx on 07/17/17.  Please call in refill for her bcp.

## 2017-07-17 ENCOUNTER — Encounter: Payer: Self-pay | Admitting: Primary Care

## 2017-07-17 ENCOUNTER — Ambulatory Visit (INDEPENDENT_AMBULATORY_CARE_PROVIDER_SITE_OTHER): Payer: BC Managed Care – PPO | Admitting: Primary Care

## 2017-07-17 VITALS — BP 124/84 | HR 99 | Temp 98.4°F | Ht 66.0 in | Wt 252.4 lb

## 2017-07-17 DIAGNOSIS — Z6841 Body Mass Index (BMI) 40.0 and over, adult: Secondary | ICD-10-CM | POA: Diagnosis not present

## 2017-07-17 DIAGNOSIS — Z3041 Encounter for surveillance of contraceptive pills: Secondary | ICD-10-CM

## 2017-07-17 DIAGNOSIS — Z0000 Encounter for general adult medical examination without abnormal findings: Secondary | ICD-10-CM

## 2017-07-17 DIAGNOSIS — O1003 Pre-existing essential hypertension complicating the puerperium: Secondary | ICD-10-CM | POA: Diagnosis not present

## 2017-07-17 MED ORDER — NORETHINDRONE ACET-ETHINYL EST 1-20 MG-MCG PO TABS: 1.0000 | ORAL_TABLET | Freq: Every day | ORAL | 3 refills | Status: DC

## 2017-07-17 NOTE — Assessment & Plan Note (Signed)
OCP's refilled today °

## 2017-07-17 NOTE — Assessment & Plan Note (Signed)
Discussed the importance of a healthy diet and regular exercise in order for weight loss, and to reduce the risk of other medical problems.  

## 2017-07-17 NOTE — Assessment & Plan Note (Signed)
Stable in the office today, continue to monitor.  

## 2017-07-17 NOTE — Patient Instructions (Addendum)
Complete lab work prior to leaving today. I will notify you of your results once received.   Start exercising. You should be getting 150 minutes of moderate intensity exercise weekly.  It's important to improve your diet by reducing consumption of fast food, fried food, processed snack foods, sugary drinks. Increase consumption of fresh vegetables and fruits, whole grains, water.  Ensure you are drinking 64 ounces of water daily.  Follow up in 1 year for your annual exam or sooner if needed! It was a pleasure to see you today!  

## 2017-07-17 NOTE — Assessment & Plan Note (Signed)
Td UTD, declines influenza vaccination. Discussed the importance of a healthy diet and regular exercise in order for weight loss, and to reduce the risk of other medical problems. Pap smear due in 2019. Exam unremarkable. Labs pending. Follow up in 1 year.

## 2017-07-17 NOTE — Progress Notes (Signed)
Subjective:    Patient ID: Donna Mccullough, female    DOB: June 14, 1991, 26 y.o.   MRN: [id number]  HPI  Donna Mccullough is a 26 year old female who presents today for complete physical.  Immunizations: -Tetanus: Completed in 2017 -Influenza: Due   Diet: She endorses a poor diet. Breakfast: Muffin Lunch: Left overs  Dinner: Take out Snacks: None Desserts: 1-2 times weekly Beverages: Water, soda 2-3 times weekly, sweet tea  Exercise: She does not currently exercise Eye exam: Completed several years ago. Dental exam: Due today. Pap Smear: Completed in 2016.    Review of Systems  Constitutional: Negative for unexpected weight change.  HENT: Negative for rhinorrhea.   Respiratory: Negative for cough and shortness of breath.   Cardiovascular: Negative for chest pain.  Gastrointestinal: Negative for constipation and diarrhea.  Genitourinary: Negative for difficulty urinating and menstrual problem.  Musculoskeletal: Negative for arthralgias and myalgias.  Skin: Negative for rash.  Allergic/Immunologic: Negative for environmental allergies.  Neurological: Negative for dizziness, numbness and headaches.  Psychiatric/Behavioral:       Has been stressed recently, able to manage.       Past Medical History:  Diagnosis Date  . Elevated blood pressure reading    10/12/11 141/95  09/25/13 140/85  Initial BP at [redacted] weeks GA 139/84  . Status post vacuum-assisted vaginal delivery (4/6) 01/19/2016     Social History   Social History  . Marital status: Married    Spouse name: N/A  . Number of children: N/A  . Years of education: N/A   Occupational History  . Not on file.   Social History Main Topics  . Smoking status: Never Smoker  . Smokeless tobacco: Never Used  . Alcohol use No     Comment: socially  . Drug use: No  . Sexual activity: Yes    Birth control/ protection: None   Other Topics Concern  . Not on file   Social History Narrative   Married.   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   Highest level of education was bachelors.     Past Surgical History:  Procedure Laterality Date  . BREAST REDUCTION SURGERY Bilateral 2014    Family History  Problem Relation Age of Onset  . Hypertension Mother   . Ulcers Mother   . Cancer Maternal Grandfather        prostate  . Cancer Paternal Grandfather        prostate  . Diabetes Paternal Grandfather   . Hypertension Paternal Grandfather   . Hyperlipidemia Paternal Grandfather     No Known Allergies  No current outpatient prescriptions on file prior to visit.   No current facility-administered medications on file prior to visit.     BP 124/84   Pulse 99   Temp 98.4 F (36.9 C) (Oral)   Ht  (1.676 m)   Wt 252 lb 6.4 oz (114.5 kg)   LMP 07/10/2017   SpO2 95%   BMI 40.74 kg/m    Objective:   Physical Exam  Constitutional: She is oriented to person, place, and time. She appears well-nourished.  HENT:  Right Ear: Tympanic membrane and ear canal normal.  Left Ear: Tympanic membrane and ear canal normal.  Nose: Nose normal.  Mouth/Throat: Oropharynx is clear and moist.  Eyes: Pupils are equal, round, and reactive to light. Conjunctivae and EOM are normal.  Neck: Neck supple. No thyromegaly present.  Cardiovascular: Normal rate and regular rhythm.  No murmur heard. Pulmonary/Chest: Effort normal and breath sounds normal. She has no rales.  Abdominal: Soft. Bowel sounds are normal. There is no tenderness.  Musculoskeletal: Normal range of motion.  Lymphadenopathy:    She has no cervical adenopathy.  Neurological: She is alert and oriented to person, place, and time. She has normal reflexes. No cranial nerve deficit.  Skin: Skin is warm and dry. No rash noted.  Psychiatric: She has a normal mood and affect.          Assessment & Plan:

## 2017-07-18 LAB — BASIC METABOLIC PANEL
BUN: 11 mg/dL (ref 6–23)
CHLORIDE: 104 meq/L (ref 96–112)
CO2: 26 meq/L (ref 19–32)
Calcium: 9 mg/dL (ref 8.4–10.5)
Creatinine, Ser: 0.73 mg/dL (ref 0.40–1.20)
GFR: 123.69 mL/min (ref >60.00)
GLUCOSE: 82 mg/dL (ref 70–99)
POTASSIUM: 4 meq/L (ref 3.5–5.1)
SODIUM: 136 meq/L (ref 135–145)

## 2017-07-18 LAB — LIPID PANEL
CHOL/HDL RATIO: 3
Cholesterol: 141 mg/dL (ref 0–200)
HDL: 48.9 mg/dL (ref >39.00)
LDL CALC: 79 mg/dL (ref 0–99)
NONHDL: 91.75
Triglycerides: 66 mg/dL (ref 0.0–149.0)
VLDL: 13.2 mg/dL (ref 0.0–40.0)

## 2017-07-19 ENCOUNTER — Encounter: Payer: Self-pay | Admitting: *Deleted

## 2017-12-01 IMAGING — CT CT ANGIO CHEST
1 of 6 series · 19 of 36 positions shown · IV contrast (APPLIED)
Comparison: Chest radiographs obtained yesterday.

CLINICAL DATA: Mid chest pain for the past 3 weeks. Elevated
D-dimer. Previous breast reduction surgery.

EXAM:
CT ANGIOGRAPHY CHEST WITH CONTRAST
TECHNIQUE: Multidetector CT imaging of the chest was performed using the
standard protocol during bolus administration of intravenous
contrast. Multiplanar CT image reconstructions and MIPs were
obtained to evaluate the vascular anatomy.
CONTRAST:  75 cc Isovue 370

[Series 5: thins · axial · 0.77mm/px · z∈[-472,-254]mm · 19 of 244 slices shown]
[im 13/244  lung]
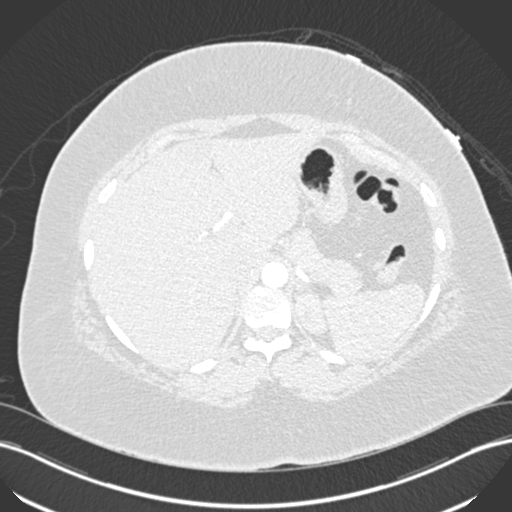
[im 25/244  mediastinal]
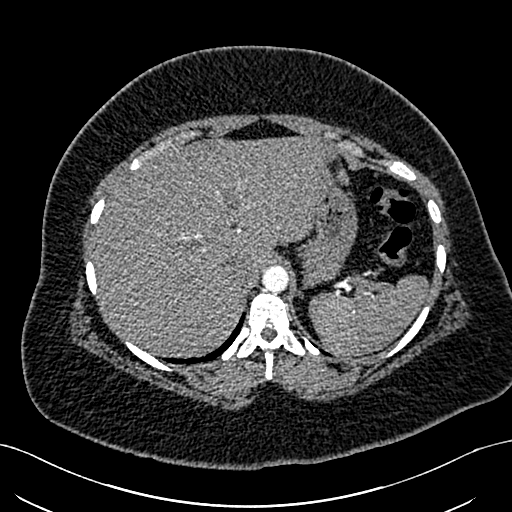
[im 37/244  lung]
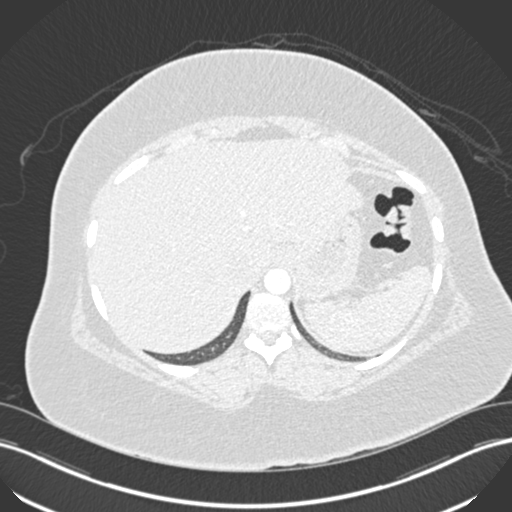
[im 49/244  mediastinal]
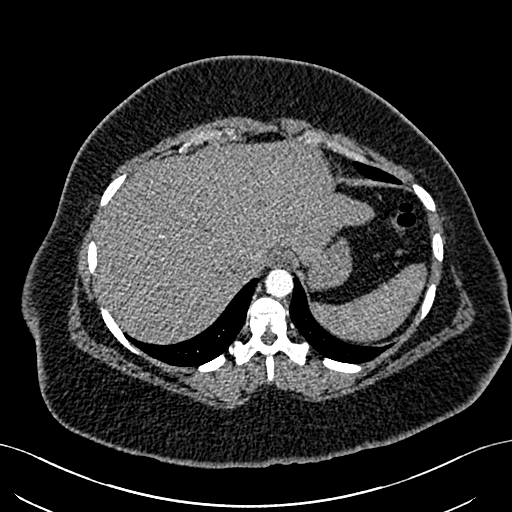
[im 61/244  lung]
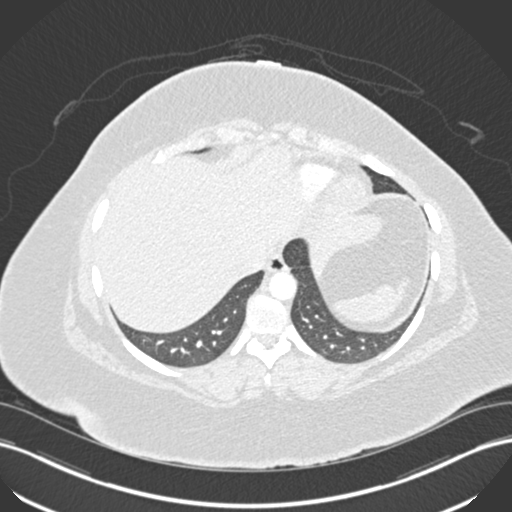
[im 73/244  mediastinal]
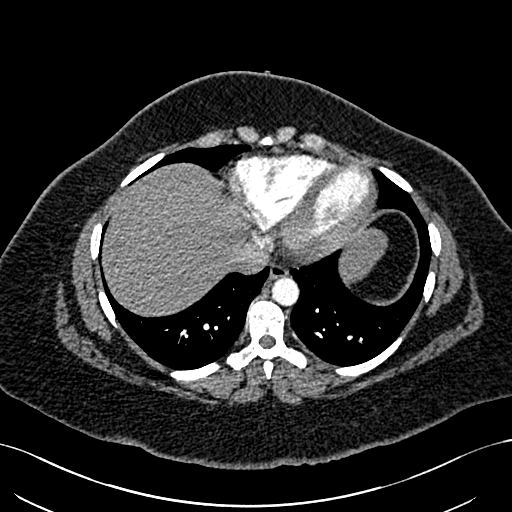
[im 86/244  lung]
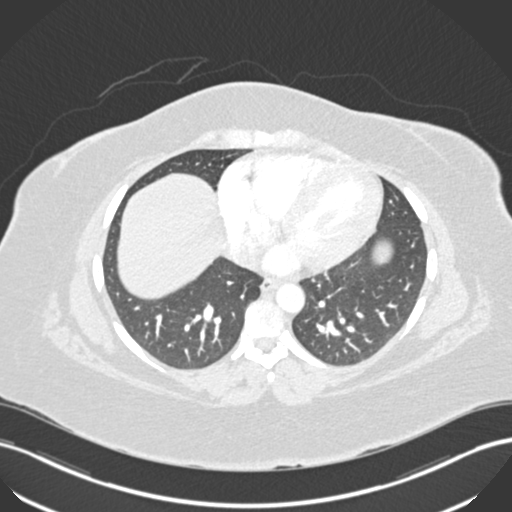
[im 98/244  mediastinal]
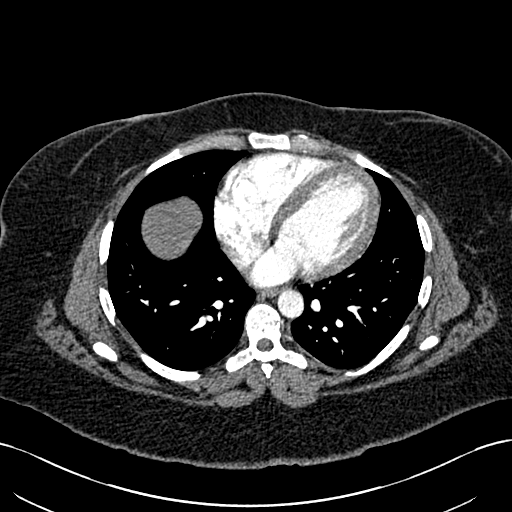
[im 110/244  lung]
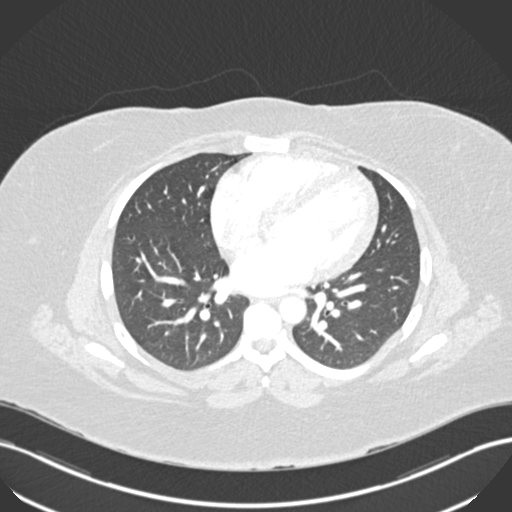
[im 122/244  mediastinal]
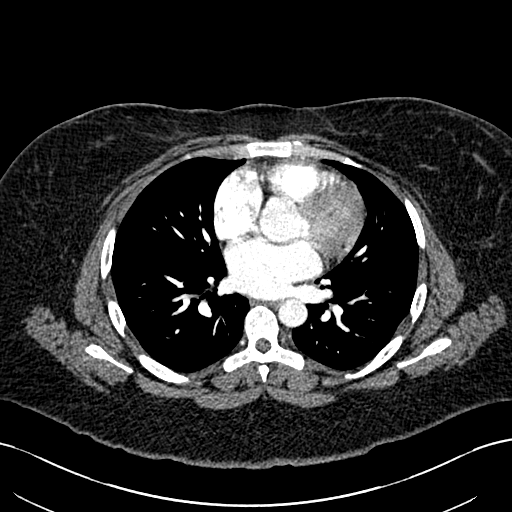
[im 134/244  lung]
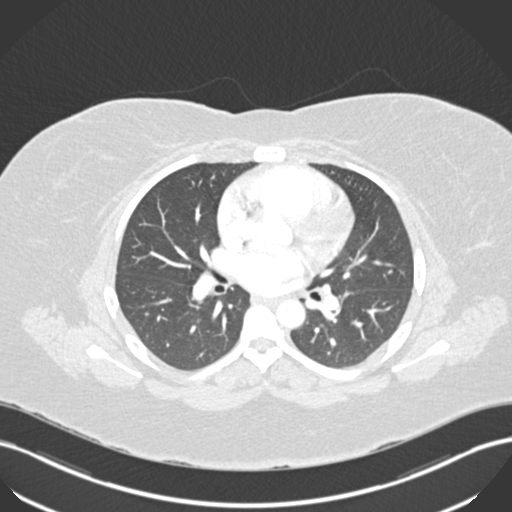
[im 146/244  mediastinal]
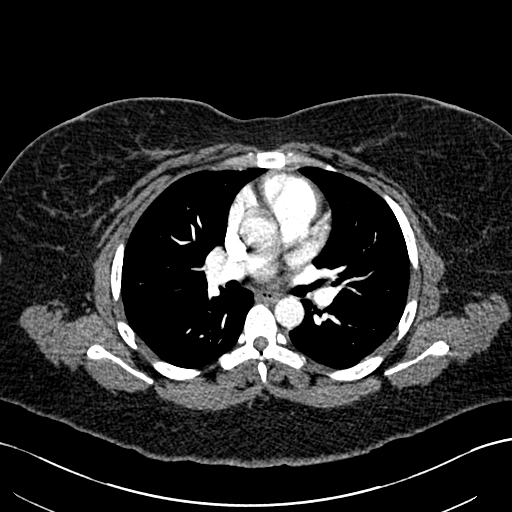
[im 158/244  lung]
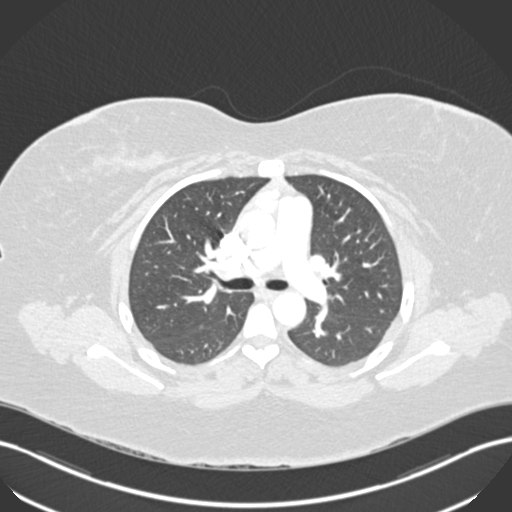
[im 171/244  mediastinal]
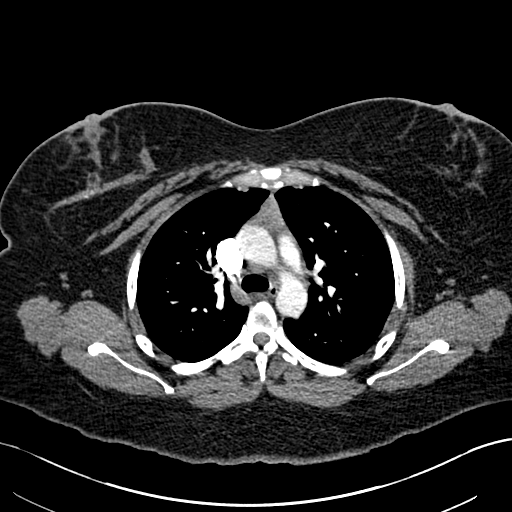
[im 183/244  lung]
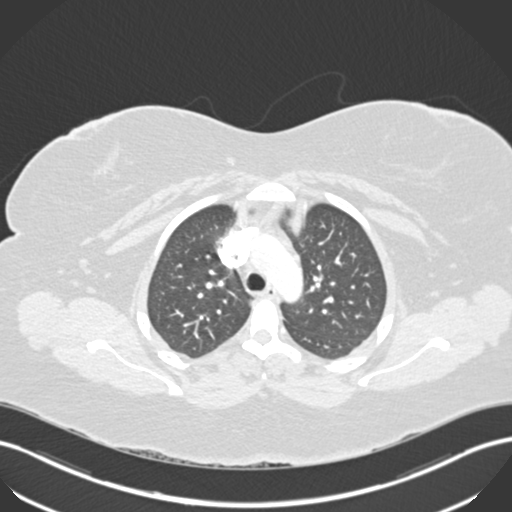
[im 195/244  mediastinal]
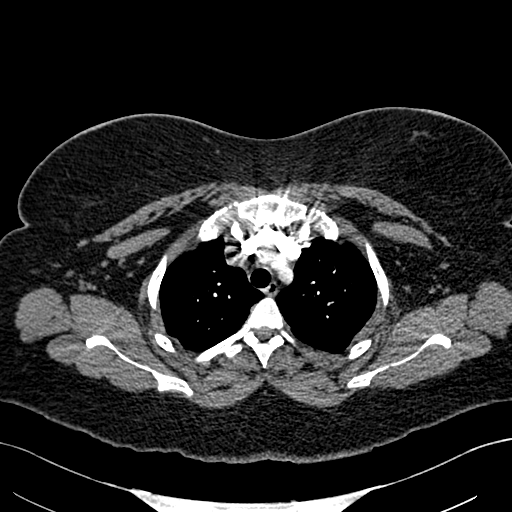
[im 207/244  lung]
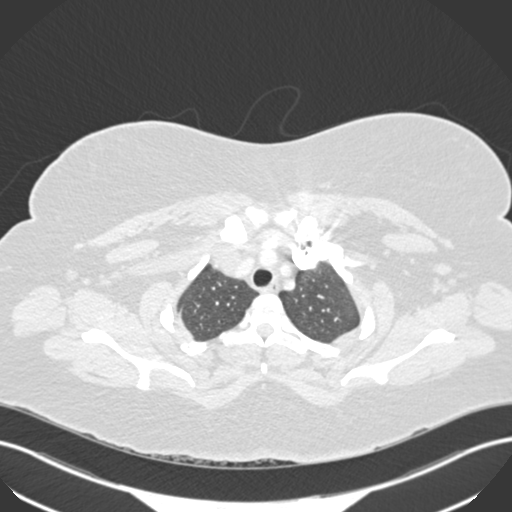
[im 219/244  mediastinal]
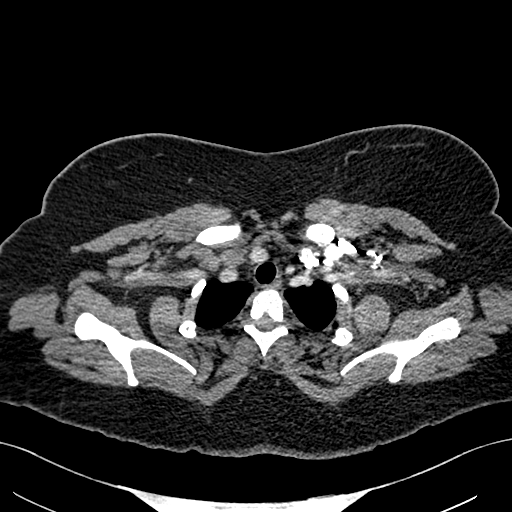
[im 231/244  lung]
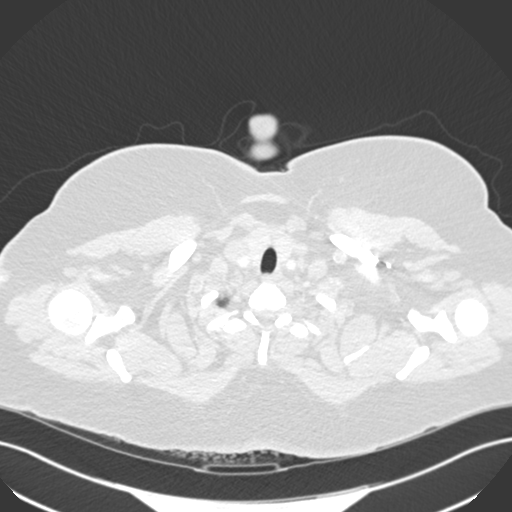

[19 of 36 positions shown; findings below may reference images not displayed]

FINDINGS: Cardiovascular: Normally opacified pulmonary arteries with no
pulmonary arterial filling defects seen. Normal sized heart.

Mediastinum/Nodes: No enlarged mediastinal, hilar, or axillary lymph
nodes. Thyroid gland, trachea, and esophagus demonstrate no
significant findings.

Lungs/Pleura: Clear lungs.  No pleural fluid.

Upper Abdomen: Unremarkable.

Musculoskeletal: Minimal thoracic spine degenerative changes.
Postsurgical fat necrosis in the central right breast.

Review of the MIP images confirms the above findings.
IMPRESSION: No pulmonary emboli or acute abnormality.

## 2017-12-23 ENCOUNTER — Ambulatory Visit (INDEPENDENT_AMBULATORY_CARE_PROVIDER_SITE_OTHER): Payer: BC Managed Care – PPO

## 2017-12-23 ENCOUNTER — Ambulatory Visit (INDEPENDENT_AMBULATORY_CARE_PROVIDER_SITE_OTHER): Payer: BC Managed Care – PPO | Admitting: Orthopaedic Surgery

## 2017-12-23 ENCOUNTER — Encounter (INDEPENDENT_AMBULATORY_CARE_PROVIDER_SITE_OTHER): Payer: Self-pay | Admitting: Orthopaedic Surgery

## 2017-12-23 DIAGNOSIS — M25561 Pain in right knee: Secondary | ICD-10-CM

## 2017-12-23 MED ORDER — LIDOCAINE HCL 1 % IJ SOLN
2.0000 mL | INTRAMUSCULAR | Status: AC | PRN
  Administered 2017-12-23: 2 mL

## 2017-12-23 MED ORDER — BUPIVACAINE HCL 0.25 % IJ SOLN
2.0000 mL | INTRAMUSCULAR | Status: AC | PRN
  Administered 2017-12-23: 2 mL via INTRA_ARTICULAR

## 2017-12-23 MED ORDER — METHYLPREDNISOLONE ACETATE 40 MG/ML IJ SUSP
40.0000 mg | INTRAMUSCULAR | Status: AC | PRN
  Administered 2017-12-23: 40 mg via INTRA_ARTICULAR

## 2017-12-23 NOTE — Progress Notes (Signed)
Office Visit Note   Patient: Donna Mccullough           Date of Birth: 01/07/1991           MRN: [id number] Visit Date: 12/23/2017              Requested by: Doreene Nestlark, Katherine K, NP [address] [place], [place] [id number] PCP: Doreene Nestlark, Katherine K, NP   Assessment & Plan: Visit Diagnoses:  1. Acute pain of right knee     Plan: Impression is right knee questionable lateral meniscus tear.  At this point, Ms. Donna Mccullough has exhausted oral anti-inflammatories and I feel it is appropriate to proceed with an intra-articular cortisone injection.  If she is not any better over the next few weeks, she will call and let us know and we will get an MRI to assess her meniscus.  She will call with concerns or questions in the meantime.  Follow-Up Instructions: Return if symptoms worsen or fail to improve.   Orders:  Orders Placed This Encounter  Procedures  . XR KNEE 3 VIEW RIGHT   No orders of the defined types were placed in this encounter.     Procedures: Large Joint Inj: R knee on 12/23/2017 2:23 PM Indications: pain Details: 22 G needle, anterolateral approach Medications: 2 mL lidocaine 1 %; 2 mL bupivacaine 0.25 %; 40 mg methylPREDNISolone acetate 40 MG/ML      Clinical Data: No additional findings.   Subjective: Chief Complaint  Patient presents with  . Right Knee - Pain    HPI Ms. Donna Mccullough is a pleasant 27 year old female who presents our clinic today with right knee pain.  She states about a month ago she had a severe left lower leg cramps while sleeping, she jumped out of bed and twisted her right knee.  Since then she has had pain to the anterolateral aspect.  She describes as a deep ache with associated swelling.  No locking catching or instability.  She does have increased pain with external rotation at the hip as well as going down stairs and flexing her knee for long periods of time.  She is tried ice, elevation and rest with minimal relief of symptoms.  She is also tried  Advil which minimally helps.  She is a Manufacturing systems engineerpreschool teacher and is going from seated to standing positions often.  No previous injury to either knee.  Review of Systems as detailed in HPI.  All others are negative.   Objective: Vital Signs: There were no vitals taken for this visit.  Physical Exam well-developed well-nourished female in no acute distress.  Alert and oriented x3.  Ortho Exam examination of her right knee reveals a trace effusion.  Range of motion from 0-100 degrees.  She is very tender over the lateral joint line.  Negative McMurray.  No tenderness over the iliotibial band at the lateral femoral condyle.  No patellofemoral crepitus.  Stable valgus varus stress.  She is neurovascular intact distally.  Specialty Comments:  No specialty comments available.  Imaging: Xr Knee 3 View Right  Result Date: 12/23/2017 X-rays show minimal decreased joint space narrowing medial compartment    PMFS History: Patient Active Problem List   Diagnosis Date Noted  . Acute pain of right knee 12/23/2017  . Encounter for surveillance of contraceptive pills 07/25/2016  . Essential hypertension-postpartum 05/14/2016  . Preventative health care 05/14/2016  . Morbid obesity with BMI of 40.0-44.9, adult (HCC) 06/13/2015  . Frequent headaches 05/17/2015   Past  Medical History:  Diagnosis Date  . Elevated blood pressure reading    10/12/11 141/95  09/25/13 140/85  Initial BP at [redacted] weeks GA 139/84  . Status post vacuum-assisted vaginal delivery (4/6) 01/19/2016    Family History  Problem Relation Age of Onset  . Hypertension Mother   . Ulcers Mother   . Cancer Maternal Grandfather        prostate  . Cancer Paternal Grandfather        prostate  . Diabetes Paternal Grandfather   . Hypertension Paternal Grandfather   . Hyperlipidemia Paternal Grandfather     Past Surgical History:  Procedure Laterality Date  . BREAST REDUCTION SURGERY Bilateral 2014   Social History   Occupational  History  . Not on file  Tobacco Use  . Smoking status: Never Smoker  . Smokeless tobacco: Never Used  Substance and Sexual Activity  . Alcohol use: No    Alcohol/week: 0.0 oz    Comment: socially  . Drug use: No  . Sexual activity: Yes    Birth control/protection: None

## 2018-05-12 ENCOUNTER — Other Ambulatory Visit: Payer: Self-pay | Admitting: Primary Care

## 2018-05-12 DIAGNOSIS — Z3041 Encounter for surveillance of contraceptive pills: Secondary | ICD-10-CM

## 2018-07-14 ENCOUNTER — Other Ambulatory Visit: Payer: Self-pay | Admitting: Primary Care

## 2018-07-14 DIAGNOSIS — Z3041 Encounter for surveillance of contraceptive pills: Secondary | ICD-10-CM

## 2018-09-30 ENCOUNTER — Other Ambulatory Visit: Payer: Self-pay | Admitting: Primary Care

## 2018-09-30 DIAGNOSIS — Z3041 Encounter for surveillance of contraceptive pills: Secondary | ICD-10-CM
